# Patient Record
Sex: Female | Born: 1995 | Race: Black or African American | Hispanic: No | Marital: Single | Smoking: Never smoker
Health system: Southern US, Community
[De-identification: ages and names within clinical notes are randomized; demographics above are authoritative.]

## PROBLEM LIST (undated history)

## (undated) DIAGNOSIS — R519 Headache, unspecified: Secondary | ICD-10-CM

## (undated) DIAGNOSIS — G932 Benign intracranial hypertension: Secondary | ICD-10-CM

## (undated) HISTORY — DX: Headache, unspecified: R51.9

## (undated) HISTORY — PX: LUMBAR PUNCTURE: SHX1985

## (undated) HISTORY — PX: EYE SURGERY: SHX253

---

## 2020-06-17 ENCOUNTER — Ambulatory Visit: Payer: Self-pay | Admitting: Obstetrics & Gynecology

## 2020-11-10 ENCOUNTER — Emergency Department (HOSPITAL_COMMUNITY): Payer: 59

## 2020-11-10 ENCOUNTER — Other Ambulatory Visit: Payer: Self-pay

## 2020-11-10 ENCOUNTER — Emergency Department (HOSPITAL_COMMUNITY)
Admission: EM | Admit: 2020-11-10 | Discharge: 2020-11-10 | Disposition: A | Discharge status: Home / Self Care | Payer: 59 | Attending: Emergency Medicine | Admitting: Emergency Medicine

## 2020-11-10 ENCOUNTER — Encounter (HOSPITAL_COMMUNITY): Payer: Self-pay

## 2020-11-10 DIAGNOSIS — J069 Acute upper respiratory infection, unspecified: Secondary | ICD-10-CM

## 2020-11-10 DIAGNOSIS — J029 Acute pharyngitis, unspecified: Secondary | ICD-10-CM | POA: Diagnosis not present

## 2020-11-10 DIAGNOSIS — Z20822 Contact with and (suspected) exposure to covid-19: Secondary | ICD-10-CM | POA: Insufficient documentation

## 2020-11-10 DIAGNOSIS — R059 Cough, unspecified: Secondary | ICD-10-CM | POA: Diagnosis present

## 2020-11-10 LAB — SARS CORONAVIRUS 2 (TAT 6-24 HRS): SARS Coronavirus 2: NEGATIVE

## 2020-11-10 MED ORDER — BENZONATATE 100 MG PO CAPS: 100.0000 mg | ORAL_CAPSULE | Freq: Three times a day (TID) | ORAL | 0 refills | Status: DC

## 2020-11-10 MED ORDER — ACETAMINOPHEN 500 MG PO TABS
1000.0000 mg | ORAL_TABLET | Freq: Once | ORAL | Status: AC
  Administered 2020-11-10: 1000 mg via ORAL
  Filled 2020-11-10: qty 2

## 2020-11-10 MED ORDER — KETOROLAC TROMETHAMINE 30 MG/ML IJ SOLN
30.0000 mg | Freq: Once | INTRAMUSCULAR | Status: DC
  Filled 2020-11-10: qty 1

## 2020-11-10 NOTE — Discharge Instructions (Addendum)
You were seen today for Viral URI symptoms. Your chest xray was reassuring and did not show signs of pneumonia or bronchitis. Please take Tessalon as needed every 8 hours for cough. You can take tylenol as needed for fever and body aches. IF tylenol alone doesn't help, you may also take motrin. If conditions worsen, please return back to the ED. If your covid/flu test is positive, you will receive a call from the ED.

## 2020-11-10 NOTE — ED Provider Notes (Addendum)
[facility]-EMERGENCY DEPT Provider Note   CSN: [id number] Arrival date & time: 11/10/20  1004     History Chief Complaint  Patient presents with   Cough    Christine Snyder is a 25 y.o. female.  HPI  Patient presents with cough, body aches, fatigue, sore throat x3 days.  She is COVID vaccinated, but not boosted.  No COVID-positive exposures.  Has tried Zaxby's, no relief.  Endorses some shortness of breath and some chest tightness.  History reviewed. No pertinent past medical history.  There are no problems to display for this patient.   History reviewed. No pertinent surgical history.   OB History   No obstetric history on file.     No family history on file.     Home Medications Prior to Admission medications   Not on File    Allergies    Patient has no allergy information on record.  Review of Systems   Review of Systems  Constitutional:  Positive for fatigue and fever.  HENT:  Positive for congestion and sore throat.   Respiratory:  Positive for cough, chest tightness and shortness of breath.   Cardiovascular:  Negative for chest pain and leg swelling.  Gastrointestinal:  Negative for diarrhea, nausea and vomiting.  Musculoskeletal:  Positive for myalgias.  Neurological:  Positive for headaches.   Physical Exam Updated Vital Signs BP 129/88   Pulse 93   Temp 98.3 F (36.8 C) (Oral)   Resp 16   Ht 5\' 4"  (1.626 m)   Wt 86.2 kg   LMP 11/07/2020 (Exact Date)   SpO2 98%   BMI 32.61 kg/m   Physical Exam Vitals and nursing note reviewed. Exam conducted with a chaperone present.  Constitutional:      General: She is not in acute distress.    Appearance: Normal appearance. She is obese.  HENT:     Head: Normocephalic and atraumatic.  Eyes:     General: No scleral icterus.    Extraocular Movements: Extraocular movements intact.     Pupils: Pupils are equal, round, and reactive to light.  Cardiovascular:     Rate and Rhythm:  Normal rate and regular rhythm.  Pulmonary:     Effort: Pulmonary effort is normal.     Breath sounds: Normal breath sounds.     Comments: Lungs are clear to auscultation bilaterally.  Patient speaking in full set sentences.  98% on room air.  No accessory muscle use.  Lung sounds are somewhat diminished, likely secondary to body habitus. Musculoskeletal:     Comments: Legs are symmetric.  No warmth, calf tenderness to palpation, edema.  Skin:    Coloration: Skin is not jaundiced.  Neurological:     Mental Status: She is alert. Mental status is at baseline.     Coordination: Coordination normal.    ED Results / Procedures / Treatments   Labs (all labs ordered are listed, but only abnormal results are displayed) Labs Reviewed - No data to display  EKG None  Radiology No results found.  Procedures Procedures   Medications Ordered in ED Medications  acetaminophen (TYLENOL) tablet 1,000 mg (has no administration in time range)  ketorolac (TORADOL) 30 MG/ML injection 30 mg (has no administration in time range)    ED Course  I have reviewed the triage vital signs and the nursing notes.  Pertinent labs & imaging results that were available during my care of the patient were reviewed by me and considered in my medical  decision making (see chart for details).    MDM Rules/Calculators/A&P                          Patient presents with viral URI symptoms x3 days.  Will order chest x-ray given somewhat diminished lung sounds, although I do suspect that is secondary to body habitus.  Some suspicion for pneumonia, but most likely believe this is a viral URI.  Will test for COVID and flu.  Will treat symptoms.  Further dispo pending chest x-ray.  Discussed option of Paxlovid if patient tested COVID-positive.  Patient states she is not interested.  X-ray is negative for signs of pneumonia or bronchitis.  Suspect viral URI.  She is PERC negative, not tachycardic, not tachypneic, not in  respiratory distress, satting at 98% on room air, doubt this is due to a PE.  Advised patient with return precautions.  Prescribing symptomatic medicine.  Discussed quarantine if positive for COVID.  Patient is in agreement with the plan.  Final Clinical Impression(s) / ED Diagnoses Final diagnoses:  None    Rx / DC Orders ED Discharge Orders     None        Theron Arista, PA-C 11/10/20 1205    Theron Arista, PA-C 11/10/20 1205    Alvira Monday, MD 11/11/20 [phone]

## 2020-11-10 NOTE — ED Triage Notes (Signed)
C/o coughing, body aches, fatigue, sore throat since Monday.  Reports dry cough.   A/Ox4 Ambulatory in triage.

## 2021-06-08 ENCOUNTER — Emergency Department (HOSPITAL_BASED_OUTPATIENT_CLINIC_OR_DEPARTMENT_OTHER): Payer: Medicaid Other | Admitting: Radiology

## 2021-06-08 ENCOUNTER — Emergency Department (HOSPITAL_BASED_OUTPATIENT_CLINIC_OR_DEPARTMENT_OTHER)
Admission: EM | Admit: 2021-06-08 | Discharge: 2021-06-08 | Disposition: A | Discharge status: Home / Self Care | Payer: Medicaid Other | Attending: Emergency Medicine | Admitting: Emergency Medicine

## 2021-06-08 ENCOUNTER — Encounter (HOSPITAL_BASED_OUTPATIENT_CLINIC_OR_DEPARTMENT_OTHER): Payer: Self-pay | Admitting: Obstetrics and Gynecology

## 2021-06-08 ENCOUNTER — Other Ambulatory Visit: Payer: Self-pay

## 2021-06-08 DIAGNOSIS — R000 Tachycardia, unspecified: Secondary | ICD-10-CM | POA: Insufficient documentation

## 2021-06-08 DIAGNOSIS — Z20822 Contact with and (suspected) exposure to covid-19: Secondary | ICD-10-CM | POA: Insufficient documentation

## 2021-06-08 DIAGNOSIS — J111 Influenza due to unidentified influenza virus with other respiratory manifestations: Secondary | ICD-10-CM

## 2021-06-08 DIAGNOSIS — J101 Influenza due to other identified influenza virus with other respiratory manifestations: Secondary | ICD-10-CM | POA: Insufficient documentation

## 2021-06-08 DIAGNOSIS — R0789 Other chest pain: Secondary | ICD-10-CM | POA: Insufficient documentation

## 2021-06-08 LAB — RESP PANEL BY RT-PCR (FLU A&B, COVID) ARPGX2
Influenza A by PCR: POSITIVE — AB
Influenza B by PCR: NEGATIVE
SARS Coronavirus 2 by RT PCR: NEGATIVE

## 2021-06-08 MED ORDER — BENZONATATE 100 MG PO CAPS: 100.0000 mg | ORAL_CAPSULE | Freq: Three times a day (TID) | ORAL | 0 refills | Status: DC

## 2021-06-08 NOTE — ED Provider Notes (Signed)
[facility] EMERGENCY DEPT Provider Note   CSN: [id number] Arrival date & time: 06/08/21  1746     History  Chief Complaint  Patient presents with   Chest Pain   Headache   Generalized Body Aches    Christine Snyder is a 26 y.o. female presenting with body aches, headaches, chest tightness, fevers to 101 and abdominal discomfort for the past 2 days.  Patient denies any known sick contacts.  Has taken Robitussin that has slightly helped her however has not tried anything else.  Denies any difficulty breathing and states that her chest pain is localized to her breast area.  No nausea or vomiting, some associated diarrhea.   Chest Pain Associated symptoms: headache   Headache     Home Medications Prior to Admission medications   Medication Sig Start Date End Date Taking? Authorizing Provider  benzonatate (TESSALON) 100 MG capsule Take 1 capsule (100 mg total) by mouth every 8 (eight) hours. 11/10/20   Theron Arista, PA-C      Allergies    Patient has no known allergies.    Review of Systems   Review of Systems  Cardiovascular:  Positive for chest pain.  Neurological:  Positive for headaches.   Physical Exam Updated Vital Signs BP 120/84 (BP Location: Right Arm)    Pulse (!) 111    Temp 99.4 F (37.4 C)    Resp 16    Ht 5\' 4"  (1.626 m)    Wt 86.2 kg    LMP 06/04/2021 (Approximate)    SpO2 97%    BMI 32.61 kg/m  Physical Exam Vitals and nursing note reviewed.  Constitutional:      General: She is not in acute distress.    Appearance: Normal appearance. She is ill-appearing.  HENT:     Head: Normocephalic and atraumatic.     Right Ear: Tympanic membrane normal. No tenderness.     Left Ear: Tympanic membrane normal. No tenderness.     Mouth/Throat:     Lips: Pink.     Mouth: Mucous membranes are moist.     Pharynx: Oropharynx is clear. Uvula midline. No pharyngeal swelling or oropharyngeal exudate.  Eyes:     General: No scleral icterus.     Conjunctiva/sclera: Conjunctivae normal.  Cardiovascular:     Rate and Rhythm: Regular rhythm. Tachycardia present.  Pulmonary:     Effort: Pulmonary effort is normal. No tachypnea or respiratory distress.     Breath sounds: No decreased breath sounds.  Chest:     Chest wall: No tenderness.  Abdominal:     Palpations: Abdomen is soft.     Tenderness: There is no abdominal tenderness.  Musculoskeletal:     Right lower leg: No edema.     Left lower leg: No edema.  Skin:    General: Skin is warm and dry.     Findings: No rash.  Neurological:     Mental Status: She is alert.  Psychiatric:        Mood and Affect: Mood normal.        Behavior: Behavior normal.    ED Results / Procedures / Treatments   Labs (all labs ordered are listed, but only abnormal results are displayed) Labs Reviewed  RESP PANEL BY RT-PCR (FLU A&B, COVID) ARPGX2 - Abnormal; Notable for the following components:      Result Value   Influenza A by PCR POSITIVE (*)    All other components within normal limits    EKG EKG  Interpretation  Date/Time:  Wednesday June 08 2021 17:54:57 EST Ventricular Rate:  111 PR Interval:  120 QRS Duration: 72 QT Interval:  322 QTC Calculation: 437 R Axis:   52 Text Interpretation: Sinus tachycardia Otherwise normal ECG No previous ECGs available Confirmed by Benjiman Core [phone]) on 06/08/2021 6:12:04 PM  Radiology DG Chest 2 View  Result Date: 06/08/2021 CLINICAL DATA:  Cough, fever and chills beginning yesterday. Chest pain. EXAM: CHEST - 2 VIEW COMPARISON:  11/10/2020 FINDINGS: The heart size and mediastinal contours are within normal limits. Both lungs are clear. The visualized skeletal structures are unremarkable. IMPRESSION: No active cardiopulmonary disease. Electronically Signed   By: Danae Orleans M.D.   On: 06/08/2021 18:23    Procedures Procedures   Medications Ordered in ED Medications - No data to display  ED Course/ Medical Decision Making/ A&P                            Medical Decision Making Amount and/or Complexity of Data Reviewed Radiology: ordered.   26 year old female with URI symptoms.  Chest pain seems to be more consistent with her body aches and is reproducible on physical exam.  Low likelihood for PE.  Testing: Patient is flu positive.  She is not high risk for severe illness, Tamiflu not prescribed. -EKG in sinus tachycardia, confirmed by MD Pickering.  Tachycardia likely secondary to illness  Imaging: No signs of pneumonia, individually ordered, interpreted by me.  I agree with the radiologist.  She is tolerating p.o. intake, and we discussed proper over-the-counter care for the symptoms. Tessalon also prescribed. She voiced understanding and is agreeable to discharge home with work note  Final Clinical Impression(s) / ED Diagnoses Final diagnoses:  Flu    Rx / DC Orders Results and diagnoses were explained to the patient. Return precautions discussed in full. Patient had no additional questions and expressed complete understanding.   This chart was dictated using voice recognition software.  Despite best efforts to proofread,  errors can occur which can change the documentation meaning.    Woodroe Chen 06/08/21 1926    Benjiman Core, MD 06/09/21 1242

## 2021-06-08 NOTE — Discharge Instructions (Signed)
You may use ibuprofen over-the-counter for fevers and body aches.  You may also try DayQuil/NyQuil.  Do not take Tylenol with this because it is already built in.  I have sent benzonatate pills to your pharmacy for your cough.  A work note is attached

## 2021-06-08 NOTE — ED Notes (Signed)
Pt provided discharge instructions and prescription information. Pt was given the opportunity to ask questions and questions were answered. Discharge signature not obtained in the setting of the COVID-19 pandemic in order to reduce high touch surfaces.  ° °

## 2021-06-08 NOTE — ED Triage Notes (Signed)
Patient reports she started feeling sick on Monday. Endorses cough, chest pain, chills, sore throat and headaches.

## 2022-03-18 ENCOUNTER — Encounter (HOSPITAL_COMMUNITY): Payer: Self-pay | Admitting: *Deleted

## 2022-03-18 ENCOUNTER — Emergency Department (HOSPITAL_COMMUNITY)
Admission: EM | Admit: 2022-03-18 | Discharge: 2022-03-18 | Disposition: A | Discharge status: Home / Self Care | Payer: 59 | Attending: Emergency Medicine | Admitting: Emergency Medicine

## 2022-03-18 ENCOUNTER — Other Ambulatory Visit: Payer: Self-pay

## 2022-03-18 DIAGNOSIS — R519 Headache, unspecified: Secondary | ICD-10-CM | POA: Insufficient documentation

## 2022-03-18 LAB — COMPREHENSIVE METABOLIC PANEL
ALT: 15 U/L (ref 0–44)
AST: 17 U/L (ref 15–41)
Albumin: 4.1 g/dL (ref 3.5–5.0)
Alkaline Phosphatase: 61 U/L (ref 38–126)
Anion gap: 8 (ref 5–15)
BUN: 12 mg/dL (ref 6–20)
CO2: 24 mmol/L (ref 22–32)
Calcium: 9.2 mg/dL (ref 8.9–10.3)
Chloride: 106 mmol/L (ref 98–111)
Creatinine, Ser: 0.63 mg/dL (ref 0.44–1.00)
GFR, Estimated: >60 mL/min (ref >60)
Glucose, Bld: 87 mg/dL (ref 70–99)
Potassium: 4 mmol/L (ref 3.5–5.1)
Sodium: 138 mmol/L (ref 135–145)
Total Bilirubin: 0.6 mg/dL (ref 0.3–1.2)
Total Protein: 7.9 g/dL (ref 6.5–8.1)

## 2022-03-18 LAB — CBC WITH DIFFERENTIAL/PLATELET
Abs Immature Granulocytes: 0.03 10*3/uL (ref 0.00–0.07)
Basophils Absolute: 0 10*3/uL (ref 0.0–0.1)
Basophils Relative: 0 %
Eosinophils Absolute: 0.2 10*3/uL (ref 0.0–0.5)
Eosinophils Relative: 2 %
HCT: 38.1 % (ref 36.0–46.0)
Hemoglobin: 11.7 g/dL — ABNORMAL LOW (ref 12.0–15.0)
Immature Granulocytes: 0 %
Lymphocytes Relative: 18 %
Lymphs Abs: 1.7 10*3/uL (ref 0.7–4.0)
MCH: 25.5 pg — ABNORMAL LOW (ref 26.0–34.0)
MCHC: 30.7 g/dL (ref 30.0–36.0)
MCV: 83 fL (ref 80.0–100.0)
Monocytes Absolute: 0.4 10*3/uL (ref 0.1–1.0)
Monocytes Relative: 5 %
Neutro Abs: 6.9 10*3/uL (ref 1.7–7.7)
Neutrophils Relative %: 75 %
Platelets: 364 10*3/uL (ref 150–400)
RBC: 4.59 MIL/uL (ref 3.87–5.11)
RDW: 15.2 % (ref 11.5–15.5)
WBC: 9.3 10*3/uL (ref 4.0–10.5)
nRBC: 0 % (ref 0.0–0.2)

## 2022-03-18 MED ORDER — DIPHENHYDRAMINE HCL 50 MG/ML IJ SOLN
12.5000 mg | Freq: Once | INTRAMUSCULAR | Status: AC
  Administered 2022-03-18: 12.5 mg via INTRAVENOUS
  Filled 2022-03-18: qty 1

## 2022-03-18 MED ORDER — PROCHLORPERAZINE EDISYLATE 10 MG/2ML IJ SOLN
10.0000 mg | Freq: Once | INTRAMUSCULAR | Status: AC
  Administered 2022-03-18: 10 mg via INTRAVENOUS
  Filled 2022-03-18: qty 2

## 2022-03-18 MED ORDER — KETOROLAC TROMETHAMINE 30 MG/ML IJ SOLN
30.0000 mg | Freq: Once | INTRAMUSCULAR | Status: AC
  Administered 2022-03-18: 30 mg via INTRAVENOUS
  Filled 2022-03-18: qty 1

## 2022-03-18 NOTE — ED Provider Notes (Signed)
[facility] DEPT Provider Note   CSN: [id number] Arrival date & time: 03/18/22  1348     History  Chief Complaint  Patient presents with   Headache    Christine Snyder is a 26 y.o. female.  Differential diagnosis includes but not limited to, migraine headaches, headache associated with ICH, meningitis   Headache    Patient presents ED with complaints of headache that started several days ago.  Patient states she has been having issues with intermittent headaches.  This has been ongoing for several months.  She actually was seen by neurologist in April of this year.  Patient had been evaluated by an ophthalmologist who noted findings concerning for possible intracranial hypertension.  Patient states she had an outpatient MRI and followed up with a neurologist in [place].  Patient was supposed to have an LP at some point she states the doctor never got back to her schedule the LP.  Patient states that she stopped having headaches for a while but will get them every couple of weeks.  She denies any acute vision changes.  No numbness or weakness.  Home Medications Prior to Admission medications   Medication Sig Start Date End Date Taking? Authorizing Provider  benzonatate (TESSALON) 100 MG capsule Take 1 capsule (100 mg total) by mouth every 8 (eight) hours. 06/08/21   Redwine, Madison A, PA-C      Allergies    Patient has no known allergies.    Review of Systems   Review of Systems  Neurological:  Positive for headaches.    Physical Exam Updated Vital Signs BP 125/81 (BP Location: Left Arm)   Pulse 84   Temp 98.2 F (36.8 C) (Oral)   Resp 18   Ht 1.651 m (5\' 5" )   Wt 83.9 kg   LMP 01/22/2022 (Exact Date)   SpO2 100%   BMI 30.79 kg/m  Physical Exam Vitals and nursing note reviewed.  Constitutional:      General: She is not in acute distress.    Appearance: She is well-developed.  HENT:     Head: Normocephalic and atraumatic.      Comments: Normal TMs, pupil equal round and reactive    Right Ear: External ear normal.     Left Ear: External ear normal.  Eyes:     General: No scleral icterus.       Right eye: No discharge.        Left eye: No discharge.     Conjunctiva/sclera: Conjunctivae normal.  Neck:     Trachea: No tracheal deviation.  Cardiovascular:     Rate and Rhythm: Normal rate and regular rhythm.  Pulmonary:     Effort: Pulmonary effort is normal. No respiratory distress.     Breath sounds: Normal breath sounds. No stridor. No wheezing or rales.  Abdominal:     General: Bowel sounds are normal. There is no distension.     Palpations: Abdomen is soft.     Tenderness: There is no abdominal tenderness. There is no guarding or rebound.  Musculoskeletal:        General: No tenderness or deformity.     Cervical back: Neck supple. No rigidity.  Skin:    General: Skin is warm and dry.     Findings: No rash.  Neurological:     General: No focal deficit present.     Mental Status: She is alert.     Cranial Nerves: No cranial nerve deficit (no facial droop, extraocular movements  intact, no slurred speech).     Sensory: No sensory deficit.     Motor: No abnormal muscle tone or seizure activity.     Coordination: Coordination normal.  Psychiatric:        Mood and Affect: Mood normal.     ED Results / Procedures / Treatments   Labs (all labs ordered are listed, but only abnormal results are displayed) Labs Reviewed  CBC WITH DIFFERENTIAL/PLATELET - Abnormal; Notable for the following components:      Result Value   Hemoglobin 11.7 (*)    MCH 25.5 (*)    All other components within normal limits  COMPREHENSIVE METABOLIC PANEL  CBC WITH DIFFERENTIAL/PLATELET  I-STAT BETA HCG BLOOD, ED ([facility], [facility], [facility] ONLY)    EKG None  Radiology No results found.  Procedures Procedures    Medications Ordered in ED Medications  prochlorperazine (COMPAZINE) injection 10 mg (10 mg Intravenous Given 03/18/22  1836)  ketorolac (TORADOL) 30 MG/ML injection 30 mg (30 mg Intravenous Given 03/18/22 1837)  diphenhydrAMINE (BENADRYL) injection 12.5 mg (12.5 mg Intravenous Given 03/18/22 1837)    ED Course/ Medical Decision Making/ A&P                           Medical Decision Making Risk Prescription drug management.   Exam is reassuring.  Patient does not have any symptoms to suggest infection.  Neck is supple.  Doubt meningitis or subarachnoid hemorrhage.  Patient was treated with migraine cocktail.  She is feeling much better at this time.  Headache is resolved and she is ready for discharge.  I did discuss lumbar puncture in the ED to assist with her evaluation of her possible ICH.  Patient prefers to follow-up with neurology as an outpatient.  Patient's not having any acute visual symptoms.  No neurologic deficits.  Do feel that she is appropriate for outpatient follow-up.  Recommend she follow-up with the neurologist she was seen previously but I also provided alternative neurologists in the area as she requested.        Final Clinical Impression(s) / ED Diagnoses Final diagnoses:  Acute nonintractable headache, unspecified headache type    Rx / DC Orders ED Discharge Orders     None         Dorie Rank, MD 03/18/22 2011

## 2022-03-18 NOTE — ED Triage Notes (Signed)
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 HA associated with light headedness, behind L eye, floaters and ear ringing. No relief with excedrin this am.

## 2022-03-18 NOTE — ED Provider Triage Note (Signed)
Emergency Medicine Provider Triage Evaluation Note  Christine Snyder , a 26 y.o. female  was evaluated in triage.  Pt complains of a headache Pt has a ICH.  Pt did not follow upwith neurology  no treatment  Review of Systems  Positive: headache Negative: fever  Physical Exam  BP (!) 140/92 (BP Location: Right Arm)   Pulse 75   Temp 98.3 F (36.8 C) (Oral)   Resp 18   Ht 5\' 5"  (1.651 m)   Wt 83.9 kg   SpO2 99%   BMI 30.79 kg/m  Gen:   Awake, no distress   Resp:  Normal effort  MSK:   Moves extremities without difficulty  Other:    Medical Decision Making  Medically screening exam initiated at 2:38 PM.  Appropriate orders placed.  Stevee Osinski was informed that the remainder of the evaluation will be completed by another provider, this initial triage assessment does not replace that evaluation, and the importance of remaining in the ED until their evaluation is complete.     Fransico Meadow, [place] 03/18/22 1439

## 2022-03-18 NOTE — Discharge Instructions (Signed)
Follow-up with a neurologist as we discussed.  Return as needed for worsening symptoms.

## 2022-04-14 DIAGNOSIS — Z419 Encounter for procedure for purposes other than remedying health state, unspecified: Secondary | ICD-10-CM | POA: Diagnosis not present

## 2022-05-04 ENCOUNTER — Telehealth: Payer: Self-pay

## 2022-05-04 NOTE — Telephone Encounter (Signed)
LMTCB. AS, CMA 

## 2022-05-15 DIAGNOSIS — Z419 Encounter for procedure for purposes other than remedying health state, unspecified: Secondary | ICD-10-CM | POA: Diagnosis not present

## 2022-05-29 ENCOUNTER — Telehealth: Payer: Self-pay

## 2022-05-29 ENCOUNTER — Encounter: Payer: Self-pay | Admitting: Neurology

## 2022-05-29 ENCOUNTER — Ambulatory Visit (INDEPENDENT_AMBULATORY_CARE_PROVIDER_SITE_OTHER): Payer: 59 | Admitting: Neurology

## 2022-05-29 VITALS — BP 132/79 | HR 92 | Ht 65.0 in | Wt 192.6 lb

## 2022-05-29 DIAGNOSIS — R635 Abnormal weight gain: Secondary | ICD-10-CM

## 2022-05-29 DIAGNOSIS — H4710 Unspecified papilledema: Secondary | ICD-10-CM | POA: Diagnosis not present

## 2022-05-29 DIAGNOSIS — G932 Benign intracranial hypertension: Secondary | ICD-10-CM

## 2022-05-29 MED ORDER — ACETAZOLAMIDE 250 MG PO TABS: 250.0000 mg | ORAL_TABLET | Freq: Two times a day (BID) | ORAL | 3 refills | Status: DC

## 2022-05-29 NOTE — Progress Notes (Addendum)
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 female here as requested by Dorie Rank, MD for Headaches. I reviewed EPIC, she has no PMHx in her chart. I reviewed ED notes from 03/18/2022 Dr. Tomi Bamberger she presented with headaches for several months. She saw a neurologist and ophthalmologist with [facility] for IDIOPATHIC INTRACRANIAL HYPERTENSION. She had an outpatient MRi never had an LP. Exam was reassuring and a migraine cocktail helped and she was discharged. I do not have her neurology notes. She went to her eye doctor, noticed [place] edema. Ringing in ears, daily headaches, pressure and more in the nack of the head and behind her ears with pressure, [facility], she wears glasses and she thought her blurry vision was just getting older, having floaters, episodes of blurry and bending over would go completely dark. Also hearing issues, fluid behind the ears, like in a seashell, neck pain. She is here from [name] she needs an IDIOPATHIC INTRACRANIAL HYPERTENSION workup. Getting [facility] notes. Moreso pressure. Especially in the back.   Reviewed notes, labs and imaging from outside physicians, which showed:  Reviewed imaging on CD: MRI brain w/wo, orbits w/wo and MRV all unremarkable.   Per [employer] notes: For evaluation of optic nerves swelling OS complaining of moderately blurred double vision and pain, she has a history of strabismus but after surgery in 2000 and has not been as pronounced, complained of foot years, diplopia OU, ringing in ears, constant headache.  Patient was diagnosed with optic nerve head edema optic disc on the right as  well as optic disc on the left.  Review of Systems: Patient complains of symptoms per HPI as well as the following symptoms headaches. Pertinent negatives and positives per HPI. All others negative.   Social History   Socioeconomic History   Marital status: Single    Spouse name: Not on file   Number of children: Not on file   Years of education: Not on file   Highest education level: Not on file  Occupational History   Not on file  Tobacco Use   Smoking status: Never    Passive exposure: Never   Smokeless tobacco: Never  Vaping Use   Vaping Use: Never used  Substance and Sexual Activity   Alcohol use: Not Currently   Drug use: Not Currently   Sexual activity: Yes    Birth control/protection: None  Other Topics Concern   Not on file  Social History Narrative   Caffiene no. 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 60 tablet 3   No current facility-administered medications for this visit.    Allergies as of 05/29/2022   (No Known Allergies)    Vitals: BP 132/79   Pulse 92   Ht 5' 5"$  (1.651 m)   Wt 192 lb 9.6 oz (87.4 kg)    BMI 32.05 kg/m  Last Weight:  Wt Readings from Last 1 Encounters:  05/29/22 192 lb 9.6 oz (87.4 kg)   Last Height:   Ht Readings from Last 1 Encounters:  05/29/22 5' 5"$  (1.651 m)     Physical exam: Exam: Gen: NAD, conversant, well nourised, obese, well groomed                     CV: RRR, no MRG. No Carotid Bruits. No peripheral edema, warm, nontender Eyes: Conjunctivae clear without exudates or hemorrhage  Neuro: Detailed Neurologic Exam  Speech:    Speech is normal; fluent and spontaneous with normal comprehension.  Cognition:    The patient is oriented to person, place, and time;     recent and remote memory intact;     language fluent;     normal attention, concentration,     fund of knowledge Cranial Nerves:    The pupils are equal, round, and reactive to light.  mild blurring optic nerves heads. Visual fields are full to finger confrontation. Extraocular movements are intact. Trigeminal sensation is intact and the muscles of mastication are normal. The face is symmetric. The palate elevates in the midline. Hearing intact. Voice is normal. Shoulder shrug is normal. The tongue has normal motion without fasciculations.   Coordination: nml  Gait: nml  Motor Observation:    No asymmetry, no atrophy, and no involuntary movements noted. Tone:    Normal muscle tone.    Posture:    Posture is normal. normal erect    Strength:    Strength is V/V in the upper and lower limbs.      Sensation: intact to LT     Reflex Exam:  DTR's:    Deep tendon reflexes in the upper and lower extremities are normal bilaterally.   Toes:    The toes are downgoing bilaterally.   Clonus:    Clonus is absent.    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 Imaging unremarkable. Appears very mild.  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 GKaty Fitch [facility] Need a primary care CHarrison Monsor one of her colleagues recommended : Address: [address], [place] [address]Phone: [phone]   Weight loss is critical, discussed weight loss and risk of permanent vision loss  Orders Placed This Encounter  Procedures   DG FLUORO GUIDED LOC OF NEEDLE/CATH TIP FOR SPINAL INJECT LT   TSH Rfx on Abnormal to Free T4  Ambulatory referral to Ophthalmology   Meds ordered this encounter  Medications   acetaZOLAMIDE (DIAMOX) 250 MG tablet    Sig: Take 1 tablet (250 mg total) by mouth 2 (two) times daily.    Dispense:  60 tablet    Refill:  3   F/u 8 weeks  For any acute change especially worsening headache or vision loss call 911 and proceed to ED  To prevent or relieve headaches, try the following: Cool Compress. Lie down and place a cool compress on your head.  Avoid headache triggers. If certain foods or odors seem to have triggered your migraines in the past, avoid them. A headache diary might help you identify triggers.  Include physical activity in your daily routine. Try a daily walk or other moderate aerobic exercise.  Manage stress. Find healthy ways to cope with the stressors, such as delegating tasks on your to-do list.  Practice relaxation techniques. Try deep breathing, yoga, massage and visualization.  Eat regularly. Eating regularly scheduled meals and maintaining a healthy diet might help prevent headaches. Also, drink plenty of fluids.  Follow a regular sleep schedule. Sleep deprivation might contribute to headaches Consider biofeedback. With this mind-body technique, you learn to control certain bodily functions -- such as muscle tension, heart rate and blood pressure -- to prevent headaches or reduce  headache pain.    Proceed to emergency room if you experience new or worsening symptoms or symptoms do not resolve, if you have new neurologic symptoms or if headache is severe, or for any concerning symptom.   Provided education and documentation from American headache Society toolbox including articles on: pseudotumoer cerebri(IIH), chronic migraine medication overuse headache, chronic migraines, prevention of migraines and other headaches, behavioral and other nonpharmacologic treatments for headache.   Orders Placed This Encounter  Procedures   DG FLUORO GUIDED LOC OF NEEDLE/CATH TIP FOR SPINAL INJECT LT   TSH Rfx on Abnormal to Free T4   Ambulatory referral to Ophthalmology   Meds ordered this encounter  Medications   acetaZOLAMIDE (DIAMOX) 250 MG tablet    Sig: Take 1 tablet (250 mg total) by mouth 2 (two) times daily.    Dispense:  60 tablet    Refill:  3    Cc: Dorie Rank, MD,  Pcp, No  Sarina Ill, MD  [facility] [address] [place], [place] [address]  Phone [phone] Fax [phone]

## 2022-05-29 NOTE — Telephone Encounter (Signed)
Care everywhere

## 2022-05-29 NOTE — Patient Instructions (Signed)
Lumbar puncture Then start Diamox Ophthalmologist Dr. Katy Fitch Need a primary care Christine Snyder : Address: [address], [place], [place] [address] Phone: [phone]  Drs Katy Fitch - OCT and RNFL for papilledema confirmed by [facility] she does not want to go there again [facility] you bypass for this patient**  Orders Placed This Encounter  Procedures   DG FLUORO GUIDED LOC OF NEEDLE/CATH TIP FOR SPINAL INJECT LT   Ambulatory referral to Ophthalmology   Meds ordered this encounter  Medications   acetaZOLAMIDE (DIAMOX) 250 MG tablet    Sig: Take 1 tablet (250 mg total) by mouth 2 (two) times daily.    Dispense:  60 tablet    Refill:  3     Acetazolamide Tablets What is this medication? ACETAZOLAMIDE (a set a ZOLE a mide) reduces swelling related to heart disease. It may also be used to reduce swelling caused by medications. It helps your kidneys remove more fluid and salt from your blood through the urine. It may also be used to treat conditions with increased pressure of the eye, such as glaucoma. It can be used with other medications to prevent and control seizures in people with epilepsy. It can also be used to prevent or treat symptoms of altitude sickness. It works by increasing the amount of oxygen in your body. It belongs to a group of medications called diuretics. This medicine may be used for other purposes; ask your health care provider or pharmacist if you have questions. COMMON BRAND NAME(S): Diamox What should I tell my care team before I take this medication? They need to know if you have any of these conditions: Glaucoma Kidney disease Liver disease Low adrenal gland function Lung or breathing disease An unusual or allergic reaction to acetazolamide, other medications, foods, dyes, or preservatives Pregnant or trying to get pregnant Breast-feeding How should I use this medication? Take this medication by mouth. Take it as directed on the  prescription label at the same time every day. You can take it with or without food. If it upsets your stomach, take it with food. Keep taking it unless your care team tells you to stop. Talk to your care team about the use of this medication in children. Special care may be needed. Overdosage: If you think you have taken too much of this medicine contact a poison control center or emergency room at once. NOTE: This medicine is only for you. Do not share this medicine with others. What if I miss a dose? If you miss a dose, take it as soon as you can. If it is almost time for your next dose, take only that dose. Do not take double or extra doses. What may interact with this medication? Do not take this medication with any of the following: Methazolamide This medication may also interact with the following: Aspirin and aspirin-like medications Cyclosporine Lithium Medication for diabetes Methenamine Other diuretics Phenytoin Primidone Quinidine Sodium bicarbonate Stimulant medications, such as dextroamphetamine This list may not describe all possible interactions. Give your health care provider a list of all the medicines, herbs, non-prescription drugs, or dietary supplements you use. Also tell them if you smoke, drink alcohol, or use illegal drugs. Some items may interact with your medicine. What should I watch for while using this medication? Visit your care team for regular checks on your progress. Tell your care team if your symptoms do not start to get better or if they get worse. This medication may  cause serious skin reactions. They can happen weeks to months after starting the medication. Contact your care team right away if you notice fevers or flu-like symptoms with a rash. The rash may be red or purple and then turn into blisters or peeling of the skin. Or, you might notice a red rash with swelling of the face, lips, or lymph nodes in your neck or under your arms. This medication  may affect your coordination, reaction time, or judgment. Do not drive or operate machinery until you know how this medication affects you. Sit up or stand slowly to reduce the risk of dizzy or fainting spells. What side effects may I notice from receiving this medication? Side effects that you should report to your care team as soon as possible: Allergic reactions--skin rash, itching, hives, swelling of the face, lips, tongue, or throat Aplastic anemia--unusual weakness or fatigue, dizziness, headache, trouble breathing, increased bleeding or bruising High acid level--trouble breathing, unusual weakness or fatigue, confusion, headache, fast or irregular heartbeat, nausea, vomiting Infection--fever, chills, cough, or sore throat Kidney stones--blood in the urine, pain or trouble passing urine, pain in the lower back or sides Liver injury--right upper belly pain, loss of appetite, nausea, light-colored stool, dark yellow or brown urine, yellowing skin or eyes, unusual weakness or fatigue Low potassium level--muscle pain or cramps, unusual weakness or fatigue, fast or irregular heartbeat, constipation Redness, blistering, peeling, or loosening of the skin, including inside the mouth Side effects that usually do not require medical attention (report to your care team if they continue or are bothersome): Blurry vision Change in taste Loss of appetite Pain, tingling, or numbness in the hands or feet This list may not describe all possible side effects. Call your doctor for medical advice about side effects. You may report side effects to FDA at 1-800-FDA-1088. Where should I keep my medication? Keep out of the reach of children and pets. Store at room temperature between 20 and 25 degrees C (68 and 77 degrees F). Throw away any unused medication after the expiration date. NOTE: This sheet is a summary. It may not cover all possible information. If you have questions about this medicine, talk to your  doctor, pharmacist, or health care provider.  2023 Elsevier/Gold Standard (2021-06-10 00:00:00)  Idiopathic Intracranial Hypertension  Idiopathic intracranial hypertension (IIH) is a condition that increases pressure around the brain. The fluid that surrounds the brain and spinal cord (cerebrospinal fluid, or CSF) increases and causes the pressure. Idiopathic means that the cause of this condition is not known. IIH affects the brain and spinal cord. If this condition is not treated, it can cause vision loss or blindness. What are the causes? The cause of this condition is not known. What increases the risk? The following factors may make you more likely to develop this condition: Being obese. Being a person who is female, between the ages of 21 and 23 years old, and who has not gone through menopause. Taking certain medicines, such as birth control, acne medicines, or steroids. What are the signs or symptoms? Symptoms of this condition include: Headaches. This is the most common symptom. Brief periods of total blindness. Double vision, blurred vision, or poor side (peripheral) vision. Pain in the shoulders or neck. Nausea and vomiting. A sound like rushing water or a pulsing sound within the ears (pulsatile tinnitus), or ringing in the ears. How is this diagnosed? This condition may be diagnosed based on: Your symptoms and medical history. Imaging tests of the brain, such  as: CT scan. MRI. Magnetic resonance venogram (MRV) to check the veins. Diagnostic lumbar puncture. This is a procedure to remove and examine a sample of CSF. This procedure can determine whether your fluid pressure is too high. An eye exam to check for swelling or nerve damage in the eyes. How is this treated? Treatment for this condition depends on the symptoms. The goal of treatment is to decrease the pressure around your brain. Common treatments include: Weight loss through healthy eating, salt restriction, and  exercise, if you are overweight. Medicines to decrease the production of CSF and lower the pressure within your skull. Medicines to prevent or treat headaches. Other treatments may include: Surgery to place drains (shunts) in your brain to remove extra fluid. Lumbar puncture to remove extra CSF. Follow these instructions at home: If you are overweight or obese, work with your health care provider to lose weight. Take over-the-counter and prescription medicines only as told by your health care provider. Ask your health care provider if the medicine prescribed to you requires you to avoid driving or using machinery. Do not use any products that contain nicotine or tobacco. These products include cigarettes, chewing tobacco, and vaping devices, such as e-cigarettes. If you need help quitting, ask your health care provider. Keep all follow-up visits. Your health care provider will need to monitor you regularly. Contact a health care provider if: You have changes in your vision, such as: Double vision. Blurred vision. Poor peripheral vision. Get help right away if: You have any of the following symptoms and they get worse or do not get better: Headaches. Nausea. Vomiting. Sudden trouble seeing. This information is not intended to replace advice given to you by your health care provider. Make sure you discuss any questions you have with your health care provider. Document Revised: 09/27/2021 Document Reviewed: 09/06/2021 Elsevier Patient Education  [place].

## 2022-05-30 ENCOUNTER — Telehealth: Payer: Self-pay | Admitting: Neurology

## 2022-05-30 LAB — TSH RFX ON ABNORMAL TO FREE T4: TSH: 0.979 u[IU]/mL (ref 0.450–4.500)

## 2022-05-30 NOTE — Telephone Encounter (Signed)
Referral sent to [facility], phone # [phone]. 

## 2022-06-06 ENCOUNTER — Encounter: Payer: Self-pay | Admitting: Neurology

## 2022-06-06 DIAGNOSIS — G971 Other reaction to spinal and lumbar puncture: Secondary | ICD-10-CM

## 2022-06-08 DIAGNOSIS — Z0289 Encounter for other administrative examinations: Secondary | ICD-10-CM

## 2022-06-13 ENCOUNTER — Inpatient Hospital Stay: Admission: RE | Admit: 2022-06-13 | Payer: 59 | Source: Ambulatory Visit

## 2022-06-15 DIAGNOSIS — Z419 Encounter for procedure for purposes other than remedying health state, unspecified: Secondary | ICD-10-CM | POA: Diagnosis not present

## 2022-06-15 NOTE — Telephone Encounter (Signed)
FMLA ready for Dr Jaynee Eagles to review, answer question about episodes/month then sign.

## 2022-06-15 NOTE — Telephone Encounter (Signed)
Forms ready for pick up

## 2022-06-15 NOTE — Telephone Encounter (Signed)
FMLA completed, signed, and sent to medical records for processing.

## 2022-06-22 NOTE — Discharge Instructions (Signed)

## 2022-06-23 ENCOUNTER — Ambulatory Visit
Admission: RE | Admit: 2022-06-23 | Discharge: 2022-06-23 | Disposition: A | Discharge status: Home / Self Care | Payer: 59 | Source: Ambulatory Visit | Attending: Neurology | Admitting: Neurology

## 2022-06-23 VITALS — BP 126/80 | HR 82

## 2022-06-23 DIAGNOSIS — G932 Benign intracranial hypertension: Secondary | ICD-10-CM

## 2022-06-23 DIAGNOSIS — R635 Abnormal weight gain: Secondary | ICD-10-CM

## 2022-06-23 DIAGNOSIS — H4710 Unspecified papilledema: Secondary | ICD-10-CM

## 2022-06-23 LAB — GLUCOSE, CSF: Glucose, CSF: 64 mg/dL (ref 40–80)

## 2022-06-23 LAB — CSF CELL COUNT WITH DIFFERENTIAL
RBC Count, CSF: 1 cells/uL — ABNORMAL HIGH
TOTAL NUCLEATED CELL: 1 cells/uL (ref 0–5)

## 2022-06-23 LAB — PROTEIN, CSF: Total Protein, CSF: 29 mg/dL (ref 15–45)

## 2022-06-26 ENCOUNTER — Telehealth: Payer: Self-pay | Admitting: Neurology

## 2022-06-26 NOTE — Telephone Encounter (Signed)
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

## 2022-06-26 NOTE — Telephone Encounter (Signed)
I called pt and gave her the results of her LP that she had done,  elevated pressure of 36.  Dr. Jaynee Eagles wanted her to start on the diamox 219m po bid for IIH.(already sent to [facility]).  Read side effects let uKoreanow if problems or questions.  She appreciated call.

## 2022-06-27 NOTE — Telephone Encounter (Signed)
Order placed for blood patch. Sent pt a Therapist, music. Called and LVM for [facility] and sent secure message to her asking for patient to be seen today for blood patch if possible.

## 2022-06-27 NOTE — Addendum Note (Signed)
Addended by: Gildardo Griffes on: 06/27/2022 12:24 PM   Modules accepted: Orders

## 2022-06-28 ENCOUNTER — Ambulatory Visit
Admission: RE | Admit: 2022-06-28 | Discharge: 2022-06-28 | Disposition: A | Discharge status: Home / Self Care | Payer: 59 | Source: Ambulatory Visit | Attending: Neurology | Admitting: Neurology

## 2022-06-28 DIAGNOSIS — G971 Other reaction to spinal and lumbar puncture: Secondary | ICD-10-CM

## 2022-06-28 NOTE — Progress Notes (Signed)
Pt arrived at our facility for blood patch due to post LP headache. Pt reports her headaches are completely gone and she feels "good". The patient states she had a horrible headache yesterday with nausea but today it is completely gone. After speaking with Dr. Jeralyn Ruths, we will not do the blood patch today as the headache has resolved. The pt agrees with this plan. However, I explained to the patient if her headache returns to give [place] a call and we will work her back in our schedule. Pt verbalized understanding.

## 2022-06-28 NOTE — Discharge Instructions (Signed)
Blood Patch Discharge Instructions  Go home and rest quietly for the next 24 hours.  It is important to lie flat for the next 24 hours.  Get up only to go to the restroom.  You may lie in the bed or on a couch on your back, your stomach, your left side or your right side.  You may have one pillow under your head.  You may have pillows between your knees while you are on your side or under your knees while you are on your back.  DO NOT drive today.  Recline the seat as far back as it will go, while still wearing your seat belt, on the way home.  You may get up to go to the bathroom as needed.  You may sit up for 10 minutes to eat.  You may resume your normal diet and medications unless otherwise indicated.  Drink lots of extra fluids today and tomorrow..   You may resume normal activities after your 24 hours of bed rest is over; however, do not exert yourself strongly or do any heavy lifting tomorrow.  Call your physician for a follow-up appointment.   If you have any questions  after you arrive home, please call [phone].  Discharge instructions have been explained to the patient.  The patient, or the person responsible for the patient, fully understands these instructions.

## 2022-07-14 DIAGNOSIS — Z419 Encounter for procedure for purposes other than remedying health state, unspecified: Secondary | ICD-10-CM | POA: Diagnosis not present

## 2022-07-17 ENCOUNTER — Telehealth: Payer: Self-pay | Admitting: Neurology

## 2022-07-17 NOTE — Telephone Encounter (Signed)
Mychart

## 2022-07-24 ENCOUNTER — Encounter: Payer: Self-pay | Admitting: Neurology

## 2022-07-24 ENCOUNTER — Ambulatory Visit (INDEPENDENT_AMBULATORY_CARE_PROVIDER_SITE_OTHER): Payer: 59 | Admitting: Neurology

## 2022-07-24 VITALS — BP 117/78 | HR 82 | Ht 65.5 in | Wt 190.8 lb

## 2022-07-24 DIAGNOSIS — R7309 Other abnormal glucose: Secondary | ICD-10-CM

## 2022-07-24 DIAGNOSIS — E669 Obesity, unspecified: Secondary | ICD-10-CM | POA: Diagnosis not present

## 2022-07-24 DIAGNOSIS — R5383 Other fatigue: Secondary | ICD-10-CM | POA: Diagnosis not present

## 2022-07-24 DIAGNOSIS — E785 Hyperlipidemia, unspecified: Secondary | ICD-10-CM

## 2022-07-24 MED ORDER — ACETAZOLAMIDE 125 MG PO TABS: 125.0000 mg | ORAL_TABLET | Freq: Two times a day (BID) | ORAL | 6 refills | Status: DC

## 2022-07-24 MED ORDER — WEGOVY 0.5 MG/0.5ML ~~LOC~~ SOAJ: 0.5000 mg | SUBCUTANEOUS | 6 refills | Status: DC

## 2022-07-24 NOTE — Addendum Note (Signed)
Addended by: Sarina Ill B on: 07/24/2022 02:44 PM   Modules accepted: Level of Service

## 2022-07-24 NOTE — Patient Instructions (Signed)
Start Mounjaro if we can. If not call [phone] and get appointment even on the waitlist for the healthy weight and wellness center Start Acetazolamide for IDIOPATHIC INTRACRANIAL HYPERTENSION  Idiopathic Intracranial Hypertension  Idiopathic intracranial hypertension (IIH) is a condition that increases pressure around the brain. The fluid that surrounds the brain and spinal cord (cerebrospinal fluid, or CSF) increases and causes the pressure. Idiopathic means that the cause of this condition is not known. IIH affects the brain and spinal cord. If this condition is not treated, it can cause vision loss or blindness. What are the causes? The cause of this condition is not known. What increases the risk? The following factors may make you more likely to develop this condition: Being obese. Being a person who is female, between the ages of 74 and 86 years old, and who has not gone through menopause. Taking certain medicines, such as birth control, acne medicines, or steroids. What are the signs or symptoms? Symptoms of this condition include: Headaches. This is the most common symptom. Brief periods of total blindness. Double vision, blurred vision, or poor side (peripheral) vision. Pain in the shoulders or neck. Nausea and vomiting. A sound like rushing water or a pulsing sound within the ears (pulsatile tinnitus), or ringing in the ears. How is this diagnosed? This condition may be diagnosed based on: Your symptoms and medical history. Imaging tests of the brain, such as: CT scan. MRI. Magnetic resonance venogram (MRV) to check the veins. Diagnostic lumbar puncture. This is a procedure to remove and examine a sample of CSF. This procedure can determine whether your fluid pressure is too high. An eye exam to check for swelling or nerve damage in the eyes. How is this treated? Treatment for this condition depends on the symptoms. The goal of treatment is to decrease the pressure around  your brain. Common treatments include: Weight loss through healthy eating, salt restriction, and exercise, if you are overweight. Medicines to decrease the production of CSF and lower the pressure within your skull. Medicines to prevent or treat headaches. Other treatments may include: Surgery to place drains (shunts) in your brain to remove extra fluid. Lumbar puncture to remove extra CSF. Follow these instructions at home: If you are overweight or obese, work with your health care provider to lose weight. Take over-the-counter and prescription medicines only as told by your health care provider. Ask your health care provider if the medicine prescribed to you requires you to avoid driving or using machinery. Do not use any products that contain nicotine or tobacco. These products include cigarettes, chewing tobacco, and vaping devices, such as e-cigarettes. If you need help quitting, ask your health care provider. Keep all follow-up visits. Your health care provider will need to monitor you regularly. Contact a health care provider if: You have changes in your vision, such as: Double vision. Blurred vision. Poor peripheral vision. Get help right away if: You have any of the following symptoms and they get worse or do not get better: Headaches. Nausea. Vomiting. Sudden trouble seeing. This information is not intended to replace advice given to you by your health care provider. Make sure you discuss any questions you have with your health care provider. Document Revised: 09/27/2021 Document Reviewed: 09/06/2021 Elsevier Patient Education  [place]. Acetazolamide Tablets What is this medication? ACETAZOLAMIDE (a set a ZOLE a mide) reduces swelling related to heart disease. It may also be used to reduce swelling caused by medications. It helps your kidneys remove more fluid  and salt from your blood through the urine. It may also be used to treat conditions with increased pressure  of the eye, such as glaucoma. It can be used with other medications to prevent and control seizures in people with epilepsy. It can also be used to prevent or treat symptoms of altitude sickness. It works by increasing the amount of oxygen in your body. It belongs to a group of medications called diuretics. This medicine may be used for other purposes; ask your health care provider or pharmacist if you have questions. COMMON BRAND NAME(S): Diamox What should I tell my care team before I take this medication? They need to know if you have any of these conditions: Glaucoma Kidney disease Liver disease Low adrenal gland function Lung or breathing disease An unusual or allergic reaction to acetazolamide, other medications, foods, dyes, or preservatives Pregnant or trying to get pregnant Breast-feeding How should I use this medication? Take this medication by mouth. Take it as directed on the prescription label at the same time every day. You can take it with or without food. If it upsets your stomach, take it with food. Keep taking it unless your care team tells you to stop. Talk to your care team about the use of this medication in children. Special care may be needed. Overdosage: If you think you have taken too much of this medicine contact a poison control center or emergency room at once. NOTE: This medicine is only for you. Do not share this medicine with others. What if I miss a dose? If you miss a dose, take it as soon as you can. If it is almost time for your next dose, take only that dose. Do not take double or extra doses. What may interact with this medication? Do not take this medication with any of the following: Methazolamide This medication may also interact with the following: Aspirin and aspirin-like medications Cyclosporine Lithium Medication for diabetes Methenamine Other diuretics Phenytoin Primidone Quinidine Sodium bicarbonate Stimulant medications, such as  dextroamphetamine This list may not describe all possible interactions. Give your health care provider a list of all the medicines, herbs, non-prescription drugs, or dietary supplements you use. Also tell them if you smoke, drink alcohol, or use illegal drugs. Some items may interact with your medicine. What should I watch for while using this medication? Visit your care team for regular checks on your progress. Tell your care team if your symptoms do not start to get better or if they get worse. This medication may cause serious skin reactions. They can happen weeks to months after starting the medication. Contact your care team right away if you notice fevers or flu-like symptoms with a rash. The rash may be red or purple and then turn into blisters or peeling of the skin. Or, you might notice a red rash with swelling of the face, lips, or lymph nodes in your neck or under your arms. This medication may affect your coordination, reaction time, or judgment. Do not drive or operate machinery until you know how this medication affects you. Sit up or stand slowly to reduce the risk of dizzy or fainting spells. What side effects may I notice from receiving this medication? Side effects that you should report to your care team as soon as possible: Allergic reactions--skin rash, itching, hives, swelling of the face, lips, tongue, or throat Aplastic anemia--unusual weakness or fatigue, dizziness, headache, trouble breathing, increased bleeding or bruising High acid level--trouble breathing, unusual weakness or fatigue, confusion,  headache, fast or irregular heartbeat, nausea, vomiting Infection--fever, chills, cough, or sore throat Kidney stones--blood in the urine, pain or trouble passing urine, pain in the lower back or sides Liver injury--right upper belly pain, loss of appetite, nausea, light-colored stool, dark yellow or brown urine, yellowing skin or eyes, unusual weakness or fatigue Low potassium  level--muscle pain or cramps, unusual weakness or fatigue, fast or irregular heartbeat, constipation Redness, blistering, peeling, or loosening of the skin, including inside the mouth Side effects that usually do not require medical attention (report to your care team if they continue or are bothersome): Blurry vision Change in taste Loss of appetite Pain, tingling, or numbness in the hands or feet This list may not describe all possible side effects. Call your doctor for medical advice about side effects. You may report side effects to FDA at 1-800-FDA-1088. Where should I keep my medication? Keep out of the reach of children and pets. Store at room temperature between 20 and 25 degrees C (68 and 77 degrees F). Throw away any unused medication after the expiration date. NOTE: This sheet is a summary. It may not cover all possible information. If you have questions about this medicine, talk to your doctor, pharmacist, or health care provider.  2023 Elsevier/Gold Standard (2021-03-15 00:00:00)

## 2022-07-24 NOTE — Progress Notes (Signed)
[name]    Provider:  Dr Jaynee Eagles Requesting Provider: No ref. provider found Primary Care Provider:  Pcp, No  CC:  Papilledema  06/26/2022 addendum: Opening pressure of 36 is quite elevated and shows she has IDIOPATHIC INTRACRANIAL HYPERTENSION/ She can start taking the diamox I prescribed her and I have a follow up with ehr in 4 weeks thanks   Update 07/24/2022: She felt GREAT since the LP. She has significantly improved. She was a 36 and went down a 14. She is feeling great still. Some pulsating in the ears. Will start diamox 125 bid then increase to 250 mg bid if needed.   Patient complains of symptoms per HPI as well as the following symptoms: neck pain . Pertinent negatives and positives per HPI. All others negative   HPI:  Christine Snyder is a 27 y.o. female here as requested by No ref. provider found for Headaches. I reviewed EPIC, she has no PMHx in her chart. I reviewed ED notes from 03/18/2022 Dr. Tomi Bamberger she presented with headaches for several months. She saw a neurologist and ophthalmologist with [facility] for IDIOPATHIC INTRACRANIAL HYPERTENSION. She had an outpatient MRi never had an LP. Exam was reassuring and a migraine cocktail helped and she was discharged. I do not have her neurology notes. She went to her eye doctor, noticed [place] edema. Ringing in ears, daily headaches, pressure and more in the nack of the head and behind her ears with pressure, [facility], she wears glasses and she thought her blurry vision was just getting older, having floaters, episodes of blurry and bending over would go completely dark. Also hearing issues, fluid behind the ears, like in a seashell, neck pain. She is here from Christine Snyder she needs an IDIOPATHIC INTRACRANIAL HYPERTENSION workup. Getting [facility] notes. Moreso pressure. Especially in the back.   Reviewed notes, labs and imaging from outside physicians, which showed:  Reviewed imaging on CD: MRI brain w/wo,  orbits w/wo and MRV all unremarkable.   Per [employer] notes: For evaluation of optic nerves swelling OS complaining of moderately blurred double vision and pain, she has a history of strabismus but after surgery in 2000 and has not been as pronounced, complained of foot years, diplopia OU, ringing in ears, constant headache.  Patient was diagnosed with optic nerve head edema optic disc on the right as well as optic disc on the left.  Review of Systems: Patient complains of symptoms per HPI as well as the following symptoms headaches. Pertinent negatives and positives per HPI. All others negative.   Social History   Socioeconomic History   Marital status: Single    Spouse name: Not on file   Number of children: Not on file   Years of education: Not on file   Highest education level: Not on file  Occupational History   Not on file  Tobacco Use   Smoking status: Never    Passive exposure: Never   Smokeless tobacco: Never  Vaping Use   Vaping Use: Never used  Substance and Sexual Activity   Alcohol use: Not Currently   Drug use: Not Currently   Sexual activity: Yes    Birth control/protection: None  Other Topics Concern   Not on file  Social History Narrative   Caffiene no. 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 180 tablet 6   Semaglutide-Weight Management (WEGOVY) 0.5 MG/0.5ML SOAJ Inject 0.5 mg into the skin once a week. 2 mL 6   No current facility-administered medications for this visit.    Allergies as of 07/24/2022   (No Known Allergies)    Vitals: BP 117/78   Pulse 82   Ht 5' 5.5" (1.664 m)   Wt 190 lb 12.8 oz (86.5 kg)   BMI 31.27 kg/m  Last Weight:  Wt Readings from Last 1 Encounters:  07/24/22 190 lb 12.8 oz (86.5 kg)   Last Height:   Ht Readings from Last 1 Encounters:  07/24/22 5' 5.5" (1.664 m)   Physical exam: Exam: Gen: NAD, conversant, well nourised, obese, well groomed                     CV: RRR, no MRG. No Carotid Bruits. No peripheral edema, warm, nontender Eyes: Conjunctivae clear without exudates or hemorrhage  Neuro: Detailed Neurologic Exam  Speech:    Speech is normal; fluent and spontaneous with normal comprehension.  Cognition:    The patient is oriented to person, place, and time;     recent and remote memory intact;     language fluent;     normal attention, concentration,     fund of knowledge Cranial Nerves:    The pupils are equal, Snyder, and reactive to light.Optic nerves slightly blurred but look improved Visual fields are full to finger confrontation. Extraocular movements are intact. Trigeminal sensation is intact and the muscles of mastication are normal. The face is symmetric. The palate elevates in the midline. Hearing intact. Voice is normal. Shoulder shrug is normal. The tongue has normal motion without fasciculations.   Coordination:    Normal finger to nose and heel to shin. Normal rapid alternating movements.   Gait:    Heel-toe and tandem gait are normal.   Motor Observation:    No asymmetry, no atrophy, and no  involuntary movements noted. Tone:    Normal muscle tone.    Posture:    Posture is normal. normal erect    Strength:    Strength is V/V in the upper and lower limbs.      Sensation: intact to LT     Reflex Exam:  DTR's:    Deep tendon reflexes in the upper and lower extremities are normal bilaterally.   Toes:    The toes are downgoing bilaterally.   Clonus:    Clonus is absent.   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 Imaging unremarkable. Appears very mild.  06/26/2022 addendum: Opening pressure of 36 is quite elevated and  shows she has IDIOPATHIC INTRACRANIAL HYPERTENSION/ She can start taking the diamox I prescribed her and I have a follow up with ehr in 4 weeks thanks   Try wegovy. Needs weight loss for IDIOPATHIC INTRACRANIAL HYPERTENSION and obesity BMI > 31. Had tried and failed metformin. Also has been in a formal structured weight loss program for > 3 months and had NOT been able to lose 5% of her body weight with limiting calories, increasing exercise with great compliance.   New PCP [facility] - will send results and note   Lumbar puncture OP 36 Then start Diamox if elevated OP '125mg'$  bid Ophthalmologist Dr. Katy Fitch: she does NOT want to go back to [facility] - reviewed notes, papilledmea, will follow in 6 months    Weight loss is critical, discussed weight loss and risk of permanent vision loss  Orders Placed This Encounter  Procedures   CBC with Differential/Platelets   Comprehensive metabolic panel   TSH Rfx on Abnormal to Free T4   Hemoglobin A1c   Meds ordered this encounter  Medications   acetaZOLAMIDE (DIAMOX) 125 MG tablet    Sig: Take 1 tablet (125 mg total) by mouth 2 (two) times daily.    Dispense:  180 tablet    Refill:  6   Semaglutide-Weight Management (WEGOVY) 0.5 MG/0.5ML SOAJ    Sig: Inject 0.5 mg into the skin once a week.    Dispense:  2 mL     Refill:  6     For any acute change especially worsening headache or vision loss call 911 and proceed to ED  To prevent or relieve headaches, try the following: Cool Compress. Lie down and place a cool compress on your head.  Avoid headache triggers. If certain foods or odors seem to have triggered your migraines in the past, avoid them. A headache diary might help you identify triggers.  Include physical activity in your daily routine. Try a daily walk or other moderate aerobic exercise.  Manage stress. Find healthy ways to cope with the stressors, such as delegating tasks on your to-do list.  Practice relaxation techniques. Try deep breathing, yoga, massage and visualization.  Eat regularly. Eating regularly scheduled meals and maintaining a healthy diet might help prevent headaches. Also, drink plenty of fluids.  Follow a regular sleep schedule. Sleep deprivation might contribute to headaches Consider biofeedback. With this mind-body technique, you learn to control certain bodily functions -- such as muscle tension, heart rate and blood pressure -- to prevent headaches or reduce headache pain.    Proceed to emergency room if you experience new or worsening symptoms or symptoms do not resolve, if you have new neurologic symptoms or if headache is severe, or for any concerning symptom.   Provided education and documentation from American headache Society toolbox including articles on: pseudotumoer cerebri(IIH), chronic migraine medication overuse headache, chronic migraines, prevention of migraines and other headaches, behavioral and other nonpharmacologic treatments for headache.   Orders Placed This Encounter  Procedures   CBC with Differential/Platelets   Comprehensive metabolic panel   TSH Rfx on Abnormal to Free T4   Hemoglobin A1c   Meds ordered this encounter  Medications   acetaZOLAMIDE (DIAMOX) 125 MG tablet    Sig: Take 1 tablet (125 mg total) by mouth 2 (two) times daily.     Dispense:  180 tablet    Refill:  6   Semaglutide-Weight Management (WEGOVY) 0.5 MG/0.5ML SOAJ    Sig: Inject  0.5 mg into the skin once a week.    Dispense:  2 mL    Refill:  6    Cc: Lennox Grumbles, Larkin Ina, MD  [facility] [address] [place], [place] [address]  Phone [phone] Fax [phone]

## 2022-07-25 LAB — COMPREHENSIVE METABOLIC PANEL
ALT: 13 IU/L (ref 0–32)
AST: 11 IU/L (ref 0–40)
Albumin/Globulin Ratio: 1.7 (ref 1.2–2.2)
Albumin: 4.7 g/dL (ref 4.0–5.0)
Alkaline Phosphatase: 67 IU/L (ref 44–121)
BUN/Creatinine Ratio: 14 (ref 9–23)
BUN: 11 mg/dL (ref 6–20)
Bilirubin Total: 0.3 mg/dL (ref 0.0–1.2)
CO2: 23 mmol/L (ref 20–29)
Calcium: 9.7 mg/dL (ref 8.7–10.2)
Chloride: 104 mmol/L (ref 96–106)
Creatinine, Ser: 0.76 mg/dL (ref 0.57–1.00)
Globulin, Total: 2.8 g/dL (ref 1.5–4.5)
Glucose: 79 mg/dL (ref 70–99)
Potassium: 4.2 mmol/L (ref 3.5–5.2)
Sodium: 141 mmol/L (ref 134–144)
Total Protein: 7.5 g/dL (ref 6.0–8.5)
eGFR: 110 mL/min/{1.73_m2} (ref >59)

## 2022-07-25 LAB — CBC WITH DIFFERENTIAL/PLATELET
Basophils Absolute: 0 10*3/uL (ref 0.0–0.2)
Basos: 0 %
EOS (ABSOLUTE): 0.1 10*3/uL (ref 0.0–0.4)
Eos: 1 %
Hematocrit: 37.1 % (ref 34.0–46.6)
Hemoglobin: 11.9 g/dL (ref 11.1–15.9)
Immature Grans (Abs): 0 10*3/uL (ref 0.0–0.1)
Immature Granulocytes: 0 %
Lymphocytes Absolute: 1.5 10*3/uL (ref 0.7–3.1)
Lymphs: 20 %
MCH: 25.9 pg — ABNORMAL LOW (ref 26.6–33.0)
MCHC: 32.1 g/dL (ref 31.5–35.7)
MCV: 81 fL (ref 79–97)
Monocytes Absolute: 0.5 10*3/uL (ref 0.1–0.9)
Monocytes: 6 %
Neutrophils Absolute: 5.5 10*3/uL (ref 1.4–7.0)
Neutrophils: 73 %
Platelets: 355 10*3/uL (ref 150–450)
RBC: 4.59 x10E6/uL (ref 3.77–5.28)
RDW: 14.4 % (ref 11.7–15.4)
WBC: 7.6 10*3/uL (ref 3.4–10.8)

## 2022-07-25 LAB — TSH RFX ON ABNORMAL TO FREE T4: TSH: 1.69 u[IU]/mL (ref 0.450–4.500)

## 2022-07-25 LAB — HEMOGLOBIN A1C
Est. average glucose Bld gHb Est-mCnc: 117 mg/dL
Hgb A1c MFr Bld: 5.7 % — ABNORMAL HIGH (ref 4.8–5.6)

## 2022-09-18 NOTE — H&P (Signed)
  Subjective  Patient ID: Christine Snyder is a 27 y.o. female.     HPI Here for follow up discussion breast reduction. Current 36 DDD. Reports over year history neck and back pain, pain in breasts especially with exercise, and pain over lateral chest wall when lying down. States she has tried specialty fitted bras, ibuprofen, PT for over 3 month trial without relief. Reports itching beneath bresats.    Wt- reports she participated in weight loss program while still living in [place]. This included PT and diet modification. Reports no significant weight loss.    No prior MMG. MGM with breast ca in her 52s. Reports MGM also had cervical cancer in her 2s.   Seen by Neurology 07/2022 and diagnosis idiopathic intracranial hypertension given and Diamox prescribed. This was diagnosed as part of work up for HA. Weight loss recommended and semaglutide prescribed. Per patient she is not planning to start this medication.   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 Lives with roommate. Family in area to assist with post op care.   Review of Systems  Musculoskeletal:  Positive for back pain and neck pain.  Skin:        +itching  All other systems reviewed and are negative.     Objective  Physical Exam  Cardiovascular: Normal rate, regular rhythm and normal heart sounds.    Pulmonary/Chest Effort normal and breath sounds normal.    Skin   Fitzpatrick 6    +shoulder grooving Breasts: grade 3 ptosis bilateral , no masses SN to nipple R 35.5 L 35.5 cm BW R 21 L 21 cm Nipple to IMF R 13 L 15 cm       Assessment/Plan  Macromastia Chronic neck and back pain Obesity   If patient pursues use of semaglutide as prescribed by her Neurologist, recommend stabilization weight loss prior to surgery.   At her age, do not require MMG prior to surgery.    Chronic neck and back pain that has failed conservative measures in setting of macromastia. No other cause of back pain  or shoulder pain noted. There is a reasonable likelihood that the symptoms are primarily due to macromastia; breast reduction surgery has reasonable expectation to improve symptoms.   Reviewed reduction with anchor type scars, OP surgery, drains, post operative visits and limitations, recovery. Diminished sensation nipple and breast skin, risk of nipple loss, wound healing problems, asymmetry, incidental carcinoma, changes with wt gain/loss, aging, unacceptable cosmetic appearance reviewed. Reviewed changes with pregnancy and effect of surgery on ability to breast feed. Reviewed if NAC necrosis occurs, would not be able to breast feed from this breast. Cannot assure her cup size.   Additional risks including but not limited to bleeding seroma hematoma damage to adjacent structures blood clots in legs or lungs, need for additional procedures, infection reviewed. Completed ASPS consent for breast reduction.   Drain teaching completed. Rx for tramadol given.   Anticipate > 350 g reduction from each breast.

## 2022-09-21 ENCOUNTER — Encounter (HOSPITAL_BASED_OUTPATIENT_CLINIC_OR_DEPARTMENT_OTHER): Payer: Self-pay | Admitting: Plastic Surgery

## 2022-09-21 ENCOUNTER — Other Ambulatory Visit: Payer: Self-pay

## 2022-09-26 ENCOUNTER — Encounter (HOSPITAL_BASED_OUTPATIENT_CLINIC_OR_DEPARTMENT_OTHER)
Admission: RE | Admit: 2022-09-26 | Discharge: 2022-09-26 | Disposition: A | Discharge status: Home / Self Care | Payer: 59 | Source: Ambulatory Visit | Attending: Plastic Surgery | Admitting: Plastic Surgery

## 2022-09-26 DIAGNOSIS — Z01812 Encounter for preprocedural laboratory examination: Secondary | ICD-10-CM | POA: Insufficient documentation

## 2022-09-26 LAB — BASIC METABOLIC PANEL
Anion gap: 9 (ref 5–15)
BUN: 8 mg/dL (ref 6–20)
CO2: 24 mmol/L (ref 22–32)
Calcium: 8.9 mg/dL (ref 8.9–10.3)
Chloride: 105 mmol/L (ref 98–111)
Creatinine, Ser: 0.7 mg/dL (ref 0.44–1.00)
GFR, Estimated: >60 mL/min (ref >60)
Glucose, Bld: 87 mg/dL (ref 70–99)
Potassium: 3.6 mmol/L (ref 3.5–5.1)
Sodium: 138 mmol/L (ref 135–145)

## 2022-09-26 MED ORDER — CHLORHEXIDINE GLUCONATE CLOTH 2 % EX PADS: 6.0000 | MEDICATED_PAD | Freq: Once | CUTANEOUS | Status: DC

## 2022-09-26 NOTE — Progress Notes (Signed)

## 2022-09-27 ENCOUNTER — Other Ambulatory Visit (HOSPITAL_COMMUNITY): Payer: Self-pay

## 2022-09-28 ENCOUNTER — Telehealth: Payer: Self-pay | Admitting: Neurology

## 2022-09-28 NOTE — Telephone Encounter (Signed)
LVM and sent mychart msg informing pt of need to reschedule 12/18/22 appt - MD out 

## 2022-09-29 ENCOUNTER — Other Ambulatory Visit: Payer: Self-pay

## 2022-09-29 ENCOUNTER — Ambulatory Visit (HOSPITAL_BASED_OUTPATIENT_CLINIC_OR_DEPARTMENT_OTHER): Payer: 59 | Admitting: Anesthesiology

## 2022-09-29 ENCOUNTER — Encounter (HOSPITAL_BASED_OUTPATIENT_CLINIC_OR_DEPARTMENT_OTHER)
Admission: RE | Disposition: A | Discharge status: Home / Self Care | Payer: Self-pay | Source: Home / Self Care | Attending: Plastic Surgery

## 2022-09-29 ENCOUNTER — Ambulatory Visit (HOSPITAL_BASED_OUTPATIENT_CLINIC_OR_DEPARTMENT_OTHER)
Admission: RE | Admit: 2022-09-29 | Discharge: 2022-09-29 | Disposition: A | Discharge status: Home / Self Care | Payer: 59 | Attending: Plastic Surgery | Admitting: Plastic Surgery

## 2022-09-29 ENCOUNTER — Encounter (HOSPITAL_BASED_OUTPATIENT_CLINIC_OR_DEPARTMENT_OTHER): Payer: Self-pay | Admitting: Plastic Surgery

## 2022-09-29 DIAGNOSIS — N6082 Other benign mammary dysplasias of left breast: Secondary | ICD-10-CM | POA: Diagnosis not present

## 2022-09-29 DIAGNOSIS — Z803 Family history of malignant neoplasm of breast: Secondary | ICD-10-CM | POA: Diagnosis not present

## 2022-09-29 DIAGNOSIS — Z6829 Body mass index (BMI) 29.0-29.9, adult: Secondary | ICD-10-CM | POA: Insufficient documentation

## 2022-09-29 DIAGNOSIS — N6022 Fibroadenosis of left breast: Secondary | ICD-10-CM | POA: Diagnosis not present

## 2022-09-29 DIAGNOSIS — E669 Obesity, unspecified: Secondary | ICD-10-CM | POA: Diagnosis not present

## 2022-09-29 DIAGNOSIS — Z79899 Other long term (current) drug therapy: Secondary | ICD-10-CM | POA: Insufficient documentation

## 2022-09-29 DIAGNOSIS — G932 Benign intracranial hypertension: Secondary | ICD-10-CM | POA: Diagnosis not present

## 2022-09-29 DIAGNOSIS — N6081 Other benign mammary dysplasias of right breast: Secondary | ICD-10-CM | POA: Insufficient documentation

## 2022-09-29 DIAGNOSIS — N62 Hypertrophy of breast: Secondary | ICD-10-CM | POA: Diagnosis present

## 2022-09-29 DIAGNOSIS — Z8049 Family history of malignant neoplasm of other genital organs: Secondary | ICD-10-CM | POA: Insufficient documentation

## 2022-09-29 DIAGNOSIS — N6021 Fibroadenosis of right breast: Secondary | ICD-10-CM | POA: Insufficient documentation

## 2022-09-29 DIAGNOSIS — Z01818 Encounter for other preprocedural examination: Secondary | ICD-10-CM

## 2022-09-29 HISTORY — PX: BREAST REDUCTION SURGERY: SHX8

## 2022-09-29 HISTORY — DX: Benign intracranial hypertension: G93.2

## 2022-09-29 LAB — POCT PREGNANCY, URINE: Preg Test, Ur: NEGATIVE

## 2022-09-29 SURGERY — MAMMOPLASTY, REDUCTION
Anesthesia: General | Site: Breast | Laterality: Bilateral

## 2022-09-29 MED ORDER — KETAMINE HCL 50 MG/5ML IJ SOSY
PREFILLED_SYRINGE | INTRAMUSCULAR | Status: AC
  Filled 2022-09-29: qty 5

## 2022-09-29 MED ORDER — MEPERIDINE HCL 25 MG/ML IJ SOLN: 6.2500 mg | INTRAMUSCULAR | Status: DC | PRN

## 2022-09-29 MED ORDER — ROCURONIUM BROMIDE 100 MG/10ML IV SOLN
INTRAVENOUS | Status: DC | PRN
  Administered 2022-09-29: 80 mg via INTRAVENOUS

## 2022-09-29 MED ORDER — LIDOCAINE 2% (20 MG/ML) 5 ML SYRINGE
INTRAMUSCULAR | Status: AC
  Filled 2022-09-29: qty 5

## 2022-09-29 MED ORDER — DEXAMETHASONE SODIUM PHOSPHATE 10 MG/ML IJ SOLN
INTRAMUSCULAR | Status: AC
  Filled 2022-09-29: qty 1

## 2022-09-29 MED ORDER — ONDANSETRON HCL 4 MG/2ML IJ SOLN: 4.0000 mg | Freq: Once | INTRAMUSCULAR | Status: DC | PRN

## 2022-09-29 MED ORDER — SCOPOLAMINE 1 MG/3DAYS TD PT72
MEDICATED_PATCH | TRANSDERMAL | Status: AC
  Filled 2022-09-29: qty 1

## 2022-09-29 MED ORDER — KETOROLAC TROMETHAMINE 30 MG/ML IJ SOLN
INTRAMUSCULAR | Status: AC
  Filled 2022-09-29: qty 1

## 2022-09-29 MED ORDER — HYDROMORPHONE HCL 1 MG/ML IJ SOLN
INTRAMUSCULAR | Status: AC
  Filled 2022-09-29: qty 0.5

## 2022-09-29 MED ORDER — MIDAZOLAM HCL 2 MG/2ML IJ SOLN
INTRAMUSCULAR | Status: AC
  Filled 2022-09-29: qty 2

## 2022-09-29 MED ORDER — 0.9 % SODIUM CHLORIDE (POUR BTL) OPTIME
TOPICAL | Status: DC | PRN
  Administered 2022-09-29: 300 mL

## 2022-09-29 MED ORDER — KETOROLAC TROMETHAMINE 30 MG/ML IJ SOLN: 30.0000 mg | Freq: Once | INTRAMUSCULAR | Status: DC | PRN

## 2022-09-29 MED ORDER — LIDOCAINE HCL (CARDIAC) PF 100 MG/5ML IV SOSY
PREFILLED_SYRINGE | INTRAVENOUS | Status: DC | PRN
  Administered 2022-09-29: 60 mg via INTRAVENOUS

## 2022-09-29 MED ORDER — PROPOFOL 10 MG/ML IV BOLUS
INTRAVENOUS | Status: AC
  Filled 2022-09-29: qty 20

## 2022-09-29 MED ORDER — PROPOFOL 10 MG/ML IV BOLUS
INTRAVENOUS | Status: DC | PRN
Start: 2022-09-29 — End: 2022-09-29
  Administered 2022-09-29: 200 mg via INTRAVENOUS

## 2022-09-29 MED ORDER — EPHEDRINE 5 MG/ML INJ
INTRAVENOUS | Status: AC
  Filled 2022-09-29: qty 5

## 2022-09-29 MED ORDER — CEFAZOLIN SODIUM-DEXTROSE 2-4 GM/100ML-% IV SOLN
2.0000 g | INTRAVENOUS | Status: AC
  Administered 2022-09-29: 2 g via INTRAVENOUS

## 2022-09-29 MED ORDER — LACTATED RINGERS IV SOLN: INTRAVENOUS | Status: DC

## 2022-09-29 MED ORDER — CELECOXIB 200 MG PO CAPS
ORAL_CAPSULE | ORAL | Status: AC
  Filled 2022-09-29: qty 1

## 2022-09-29 MED ORDER — SUGAMMADEX SODIUM 200 MG/2ML IV SOLN
INTRAVENOUS | Status: DC | PRN
  Administered 2022-09-29: 200 mg via INTRAVENOUS

## 2022-09-29 MED ORDER — DEXMEDETOMIDINE HCL IN NACL 80 MCG/20ML IV SOLN
INTRAVENOUS | Status: AC
  Filled 2022-09-29: qty 20

## 2022-09-29 MED ORDER — ACETAMINOPHEN 500 MG PO TABS
1000.0000 mg | ORAL_TABLET | ORAL | Status: AC
  Administered 2022-09-29: 1000 mg via ORAL

## 2022-09-29 MED ORDER — SCOPOLAMINE 1 MG/3DAYS TD PT72
1.0000 | MEDICATED_PATCH | TRANSDERMAL | Status: DC
  Administered 2022-09-29: 1.5 mg via TRANSDERMAL

## 2022-09-29 MED ORDER — ACETAMINOPHEN 500 MG PO TABS
ORAL_TABLET | ORAL | Status: AC
  Filled 2022-09-29: qty 2

## 2022-09-29 MED ORDER — FENTANYL CITRATE (PF) 100 MCG/2ML IJ SOLN
INTRAMUSCULAR | Status: AC
  Filled 2022-09-29: qty 2

## 2022-09-29 MED ORDER — HYDROMORPHONE HCL 1 MG/ML IJ SOLN
INTRAMUSCULAR | Status: AC
  Filled 2022-09-29: qty 1

## 2022-09-29 MED ORDER — OXYCODONE HCL 5 MG/5ML PO SOLN: 5.0000 mg | Freq: Once | ORAL | Status: DC | PRN

## 2022-09-29 MED ORDER — ACETAMINOPHEN 10 MG/ML IV SOLN
1000.0000 mg | Freq: Once | INTRAVENOUS | Status: AC
  Administered 2022-09-29: 1000 mg via INTRAVENOUS

## 2022-09-29 MED ORDER — HYDROMORPHONE HCL 1 MG/ML IJ SOLN
INTRAMUSCULAR | Status: DC | PRN
  Administered 2022-09-29 (×2): .5 mg via INTRAVENOUS

## 2022-09-29 MED ORDER — MIDAZOLAM HCL 5 MG/5ML IJ SOLN
INTRAMUSCULAR | Status: DC | PRN
  Administered 2022-09-29: 2 mg via INTRAVENOUS

## 2022-09-29 MED ORDER — ONDANSETRON HCL 4 MG/2ML IJ SOLN
INTRAMUSCULAR | Status: AC
  Filled 2022-09-29: qty 2

## 2022-09-29 MED ORDER — DEXMEDETOMIDINE HCL IN NACL 80 MCG/20ML IV SOLN
INTRAVENOUS | Status: DC | PRN
  Administered 2022-09-29: 12 ug via INTRAVENOUS

## 2022-09-29 MED ORDER — PHENYLEPHRINE 80 MCG/ML (10ML) SYRINGE FOR IV PUSH (FOR BLOOD PRESSURE SUPPORT)
PREFILLED_SYRINGE | INTRAVENOUS | Status: AC
  Filled 2022-09-29: qty 10

## 2022-09-29 MED ORDER — ACETAMINOPHEN 10 MG/ML IV SOLN
INTRAVENOUS | Status: AC
  Filled 2022-09-29: qty 100

## 2022-09-29 MED ORDER — FENTANYL CITRATE (PF) 100 MCG/2ML IJ SOLN
INTRAMUSCULAR | Status: DC | PRN
  Administered 2022-09-29: 100 ug via INTRAVENOUS

## 2022-09-29 MED ORDER — CEFAZOLIN SODIUM-DEXTROSE 2-4 GM/100ML-% IV SOLN
INTRAVENOUS | Status: AC
  Filled 2022-09-29: qty 100

## 2022-09-29 MED ORDER — EPHEDRINE SULFATE (PRESSORS) 50 MG/ML IJ SOLN
INTRAMUSCULAR | Status: DC | PRN
  Administered 2022-09-29: 5 mg via INTRAVENOUS

## 2022-09-29 MED ORDER — KETOROLAC TROMETHAMINE 30 MG/ML IJ SOLN
30.0000 mg | Freq: Once | INTRAMUSCULAR | Status: AC
  Administered 2022-09-29: 30 mg via INTRAVENOUS

## 2022-09-29 MED ORDER — GABAPENTIN 300 MG PO CAPS
ORAL_CAPSULE | ORAL | Status: AC
  Filled 2022-09-29: qty 1

## 2022-09-29 MED ORDER — OXYCODONE HCL 5 MG PO TABS: 5.0000 mg | ORAL_TABLET | Freq: Once | ORAL | Status: DC | PRN

## 2022-09-29 MED ORDER — PHENYLEPHRINE HCL (PRESSORS) 10 MG/ML IV SOLN
INTRAVENOUS | Status: DC | PRN
  Administered 2022-09-29 (×3): 80 ug via INTRAVENOUS

## 2022-09-29 MED ORDER — AMISULPRIDE (ANTIEMETIC) 5 MG/2ML IV SOLN
10.0000 mg | Freq: Once | INTRAVENOUS | Status: AC | PRN
  Administered 2022-09-29: 10 mg via INTRAVENOUS

## 2022-09-29 MED ORDER — GABAPENTIN 300 MG PO CAPS
300.0000 mg | ORAL_CAPSULE | ORAL | Status: AC
  Administered 2022-09-29: 300 mg via ORAL

## 2022-09-29 MED ORDER — DEXAMETHASONE SODIUM PHOSPHATE 10 MG/ML IJ SOLN
INTRAMUSCULAR | Status: DC | PRN
  Administered 2022-09-29: 10 mg via INTRAVENOUS

## 2022-09-29 MED ORDER — AMISULPRIDE (ANTIEMETIC) 5 MG/2ML IV SOLN
INTRAVENOUS | Status: AC
  Filled 2022-09-29: qty 4

## 2022-09-29 MED ORDER — KETAMINE HCL 10 MG/ML IJ SOLN
INTRAMUSCULAR | Status: DC | PRN
  Administered 2022-09-29: 40 mg via INTRAVENOUS

## 2022-09-29 MED ORDER — BUPIVACAINE HCL (PF) 0.5 % IJ SOLN
INTRAMUSCULAR | Status: DC | PRN
  Administered 2022-09-29: 30 mL

## 2022-09-29 MED ORDER — HYDROMORPHONE HCL 1 MG/ML IJ SOLN
0.2500 mg | INTRAMUSCULAR | Status: DC | PRN
  Administered 2022-09-29 (×2): 0.5 mg via INTRAVENOUS

## 2022-09-29 MED ORDER — ROCURONIUM BROMIDE 10 MG/ML (PF) SYRINGE
PREFILLED_SYRINGE | INTRAVENOUS | Status: AC
  Filled 2022-09-29: qty 10

## 2022-09-29 MED ORDER — CELECOXIB 200 MG PO CAPS
200.0000 mg | ORAL_CAPSULE | ORAL | Status: AC
  Administered 2022-09-29: 200 mg via ORAL

## 2022-09-29 MED ORDER — ONDANSETRON HCL 4 MG/2ML IJ SOLN
INTRAMUSCULAR | Status: DC | PRN
  Administered 2022-09-29: 4 mg via INTRAVENOUS

## 2022-09-29 SURGICAL SUPPLY — 45 items
ADH SKN CLS APL DERMABOND .7 (GAUZE/BANDAGES/DRESSINGS) ×2
APL PRP STRL LF DISP 70% ISPRP (MISCELLANEOUS) ×2
BINDER BREAST XLRG (GAUZE/BANDAGES/DRESSINGS) IMPLANT
BINDER BREAST XXLRG (GAUZE/BANDAGES/DRESSINGS) IMPLANT
BLADE SURG 10 STRL SS (BLADE) ×4 IMPLANT
BNDG GAUZE DERMACEA FLUFF 4 (GAUZE/BANDAGES/DRESSINGS) ×2 IMPLANT
BNDG GZE DERMACEA 4 6PLY (GAUZE/BANDAGES/DRESSINGS) ×2
CANISTER SUCT 1200ML W/VALVE (MISCELLANEOUS) ×1 IMPLANT
CHLORAPREP W/TINT 26 (MISCELLANEOUS) ×2 IMPLANT
COVER BACK TABLE 60X90IN (DRAPES) ×1 IMPLANT
COVER MAYO STAND STRL (DRAPES) ×1 IMPLANT
DERMABOND ADVANCED .7 DNX12 (GAUZE/BANDAGES/DRESSINGS) ×2 IMPLANT
DRAIN CHANNEL 15F RND FF W/TCR (WOUND CARE) IMPLANT
DRAPE TOP ARMCOVERS (MISCELLANEOUS) ×1 IMPLANT
DRAPE U-SHAPE 76X120 STRL (DRAPES) ×1 IMPLANT
DRAPE UTILITY XL STRL (DRAPES) ×1 IMPLANT
ELECT COATED BLADE 2.86 ST (ELECTRODE) ×1 IMPLANT
ELECT REM PT RETURN 9FT ADLT (ELECTROSURGICAL) ×1
ELECTRODE REM PT RTRN 9FT ADLT (ELECTROSURGICAL) ×1 IMPLANT
EVACUATOR SILICONE 100CC (DRAIN) IMPLANT
GAUZE PAD ABD 8X10 STRL (GAUZE/BANDAGES/DRESSINGS) ×2 IMPLANT
GLOVE BIO SURGEON STRL SZ 6 (GLOVE) ×2 IMPLANT
GOWN STRL REUS W/ TWL LRG LVL3 (GOWN DISPOSABLE) ×2 IMPLANT
GOWN STRL REUS W/TWL LRG LVL3 (GOWN DISPOSABLE) ×2
NDL HYPO 25X1 1.5 SAFETY (NEEDLE) ×1 IMPLANT
NEEDLE HYPO 25X1 1.5 SAFETY (NEEDLE) ×1 IMPLANT
NS IRRIG 1000ML POUR BTL (IV SOLUTION) ×1 IMPLANT
PACK BASIN DAY SURGERY FS (CUSTOM PROCEDURE TRAY) ×1 IMPLANT
PENCIL SMOKE EVACUATOR (MISCELLANEOUS) ×1 IMPLANT
PIN SAFETY STERILE (MISCELLANEOUS) ×1 IMPLANT
SHEET MEDIUM DRAPE 40X70 STRL (DRAPES) ×2 IMPLANT
SLEEVE SCD COMPRESS KNEE MED (STOCKING) ×1 IMPLANT
SPONGE T-LAP 18X18 ~~LOC~~+RFID (SPONGE) ×3 IMPLANT
STAPLER VISISTAT 35W (STAPLE) ×1 IMPLANT
SUT ETHILON 2 0 FS 18 (SUTURE) IMPLANT
SUT MNCRL AB 4-0 PS2 18 (SUTURE) IMPLANT
SUT VIC AB 3-0 PS1 18 (SUTURE) ×8
SUT VIC AB 3-0 PS1 18XBRD (SUTURE) IMPLANT
SUT VIC AB 4-0 PS2 18 (SUTURE) IMPLANT
SYR BULB IRRIG 60ML STRL (SYRINGE) ×1 IMPLANT
SYR CONTROL 10ML LL (SYRINGE) ×1 IMPLANT
TOWEL GREEN STERILE FF (TOWEL DISPOSABLE) ×2 IMPLANT
TUBE CONNECTING 20X1/4 (TUBING) ×1 IMPLANT
UNDERPAD 30X36 HEAVY ABSORB (UNDERPADS AND DIAPERS) ×2 IMPLANT
YANKAUER SUCT BULB TIP NO VENT (SUCTIONS) ×1 IMPLANT

## 2022-09-29 NOTE — Op Note (Signed)
Operative Note   DATE OF OPERATION: 5.17.24  LOCATION: [facility]-outpatient  SURGICAL DIVISION: Plastic Surgery  PREOPERATIVE DIAGNOSES:  1. Macromastia 2. Chronic neck and back pain  POSTOPERATIVE DIAGNOSES:  same  PROCEDURE:  Bilateral breast reduction  SURGEON: Glenna Fellows MD MBA  ASSISTANT: none  ANESTHESIA:  General.   EBL: 50 ml  COMPLICATIONS: None immediate.   INDICATIONS FOR PROCEDURE:  The patient, Christine Snyder, is a 27 y.o. female born on June 06, 1995, is here for treatment chronic neck and back pain in setting macromastia that has failed conservative measures.    FINDINGS: Right reduction 596 g Left reduction  644 g  DESCRIPTION OF PROCEDURE:  The patient was marked standing in the preoperative area to mark sternal notch, chest midline, anterior axillary lines, inframammary folds. The location of new nipple areolar complex was marked at level of on inframammary fold on anterior surface breast by palpation. This was marked symmetric over bilateral breasts. With aid of Wise pattern marker, location of new nipple areolar complex and vertical limbs (8 cm) were marked by displacement of breasts along meridian. The patient was taken to the operating room. SCDs were placed and IV antibiotics were given. The patient's operative site was prepped and draped in a sterile fashion. A time out was performed and all information was confirmed to be correct.     I began on left breast. Over left breast, superior medial pedicle marked and nipple areolar complex incised with 45 mm diameter marker. Pedicle deepithlialized and developed to chest wall. Breast tissue resected over lower pole. Medial and lateral flaps developed. Additional superior and lateral breast tissue excised. Breast tailor tacked closed.    I then directed attention to right breast where superior medial pedicle designed. NAC incised with 45 mm diameter marker. The pedicle was deepithelialized. Pedicle  developed to chest wall. Breast tissue resected over lower pole. Medial and lateral flaps developed. Additional superior and lateral breast tissue excised. Breast tailor tacked closed. Patient assessed for symmetry. Breast cavities irrigated and hemostasis obtained. Local anesthetic infiltrated throughout each breast. 15 Fr JP placed in each breast and secured with 2-0 nylon. Closure completed bilateral with 3-0 vicryl to approximate dermis along inframammary fold and vertical limb. NAC inset with 3-0 vicryl in dermis. Skin closure completed with 4-0 monocryl subcuticular throughout. Tissue adhesive applied. Dry dressing and breast binder applied.   The patient was allowed to wake from anesthesia, extubated and taken to the recovery room in satisfactory condition.   SPECIMENS: right and left breast reduction  DRAINS: 15 Fr JP in right and left breast  Glenna Fellows, MD [facility]  Office/ physician access line after hours [phone]

## 2022-09-29 NOTE — Transfer of Care (Signed)
Immediate Anesthesia Transfer of Care Note  Patient: Vinette Spada  Procedure(s) Performed: MAMMARY REDUCTION  (BREAST) (Bilateral: Breast)  Patient Location: PACU  Anesthesia Type:General  Level of Consciousness: awake, alert , and oriented  Airway & Oxygen Therapy: Patient Spontanous Breathing and Patient connected to face mask oxygen  Post-op Assessment: Report given to RN and Post -op Vital signs reviewed and stable  Post vital signs: Reviewed and stable  Last Vitals:  Vitals Value Taken Time  BP 121/68 09/29/22 1459  Temp    Pulse 87 09/29/22 1500  Resp 17 09/29/22 1500  SpO2 95 % 09/29/22 1500  Vitals shown include unvalidated device data.  Last Pain:  Vitals:   09/29/22 1008  TempSrc: Oral  PainSc: 0-No pain         Complications: No notable events documented.

## 2022-09-29 NOTE — Anesthesia Postprocedure Evaluation (Signed)
Anesthesia Post Note  Patient: Psychologist, counselling  Procedure(s) Performed: MAMMARY REDUCTION  (BREAST) (Bilateral: Breast)     Patient location during evaluation: PACU Anesthesia Type: General Level of consciousness: awake and alert, oriented and patient cooperative Pain management: pain level controlled Vital Signs Assessment: post-procedure vital signs reviewed and stable Respiratory status: spontaneous breathing, nonlabored ventilation and respiratory function stable Cardiovascular status: blood pressure returned to baseline and stable Postop Assessment: no apparent nausea or vomiting Anesthetic complications: no   No notable events documented.  Last Vitals:  Vitals:   09/29/22 1530 09/29/22 1545  BP:  (!) 112/57  Pulse:  83  Resp:  14  Temp:    SpO2: 98% 98%    Last Pain:  Vitals:   09/29/22 1530  TempSrc:   PainSc: 5    Pain Goal:                   Christine Snyder

## 2022-09-29 NOTE — Discharge Instructions (Signed)
No tylenol or ibuprofen until 4:10 p.m.  Post Anesthesia Home Care Instructions  Activity: Get plenty of rest for the remainder of the day. A responsible individual must stay with you for 24 hours following the procedure.  For the next 24 hours, DO NOT: -Drive a car -Advertising copywriter -Drink alcoholic beverages -Take any medication unless instructed by your physician -Make any legal decisions or sign important papers.  Meals: Start with liquid foods such as gelatin or soup. Progress to regular foods as tolerated. Avoid greasy, spicy, heavy foods. If nausea and/or vomiting occur, drink only clear liquids until the nausea and/or vomiting subsides. Call your physician if vomiting continues.  Special Instructions/Symptoms: Your throat may feel dry or sore from the anesthesia or the breathing tube placed in your throat during surgery. If this causes discomfort, gargle with warm salt water. The discomfort should disappear within 24 hours.  If you had a scopolamine patch placed behind your ear for the management of post- operative nausea and/or vomiting:  1. The medication in the patch is effective for 72 hours, after which it should be removed.  Wrap patch in a tissue and discard in the trash. Wash hands thoroughly with soap and water. 2. You may remove the patch earlier than 72 hours if you experience unpleasant side effects which may include dry mouth, dizziness or visual disturbances. 3. Avoid touching the patch. Wash your hands with soap and water after contact with the patch.       JP Drain [employer] this sheet to all of your post-operative appointments while you have your drains. Please measure your drains by CC's or ML's. Make sure you drain and measure your JP Drains 2 or 3 times per day. At the end of each day, add up totals for the left side and add up totals for the right side.    ( 9 am )     ( 3 pm )        ( 9 pm )                Date L  R  L  R  L  R  Total L/R

## 2022-09-29 NOTE — Anesthesia Procedure Notes (Signed)
Procedure Name: Intubation Date/Time: 09/29/2022 11:48 AM  Performed by: Thornell Mule, CRNAPre-anesthesia Checklist: Patient identified, Emergency Drugs available, Suction available and Patient being monitored Patient Re-evaluated:Patient Re-evaluated prior to induction Oxygen Delivery Method: Circle system utilized Preoxygenation: Pre-oxygenation with 100% oxygen Induction Type: IV induction Ventilation: Mask ventilation without difficulty Laryngoscope Size: Mac and 3 Grade View: Grade I Tube type: Oral Tube size: 7.0 mm Number of attempts: 1 Airway Equipment and Method: Stylet Placement Confirmation: ETT inserted through vocal cords under direct vision, positive ETCO2 and breath sounds checked- equal and bilateral Tube secured with: Tape Dental Injury: Teeth and Oropharynx as per pre-operative assessment

## 2022-09-29 NOTE — Anesthesia Preprocedure Evaluation (Addendum)
Anesthesia Evaluation  Patient identified by MRN, date of birth, ID band Patient awake    Reviewed: Allergy & Precautions, NPO status , Patient's Chart, lab work & pertinent test results  Airway Mallampati: II  TM Distance: >3 FB Neck ROM: Full    Dental no notable dental hx. (+) Teeth Intact, Dental Advisory Given   Pulmonary neg pulmonary ROS   Pulmonary exam normal breath sounds clear to auscultation       Cardiovascular negative cardio ROS Normal cardiovascular exam Rhythm:Regular Rate:Normal     Neuro/Psych  Headaches IIH  negative psych ROS   GI/Hepatic negative GI ROS, Neg liver ROS,,,  Endo/Other  negative endocrine ROS    Renal/GU negative Renal ROS  negative genitourinary   Musculoskeletal negative musculoskeletal ROS (+)    Abdominal   Peds  Hematology negative hematology ROS (+)   Anesthesia Other Findings Wegovy LD:   Reproductive/Obstetrics negative OB ROS                             Anesthesia Physical Anesthesia Plan  ASA: 2  Anesthesia Plan: General   Post-op Pain Management: Toradol IV (intra-op)*, Tylenol PO (pre-op)*, Ketamine IV* and Dilaudid IV   Induction: Intravenous  PONV Risk Score and Plan: 3 and Ondansetron, Dexamethasone, Treatment may vary due to age or medical condition and Midazolam  Airway Management Planned: Oral ETT  Additional Equipment: None  Intra-op Plan:   Post-operative Plan: Extubation in OR  Informed Consent: I have reviewed the patients History and Physical, chart, labs and discussed the procedure including the risks, benefits and alternatives for the proposed anesthesia with the patient or authorized representative who has indicated his/her understanding and acceptance.     Dental advisory given  Plan Discussed with: CRNA  Anesthesia Plan Comments:        Anesthesia Quick Evaluation

## 2022-09-29 NOTE — Interval H&P Note (Signed)
History and Physical Interval Note:  09/29/2022 11:12 AM  Christine Snyder  has presented today for surgery, with the diagnosis of macromastia, chronic neck and back pain.  The various methods of treatment have been discussed with the patient and family. After consideration of risks, benefits and other options for treatment, the patient has consented to  Procedure(s): MAMMARY REDUCTION  (BREAST) (Bilateral) as a surgical intervention.  The patient's history has been reviewed, patient examined, no change in status, stable for surgery.  I have reviewed the patient's chart and labs.  Questions were answered to the patient's satisfaction.     [name]

## 2022-10-02 ENCOUNTER — Encounter (HOSPITAL_BASED_OUTPATIENT_CLINIC_OR_DEPARTMENT_OTHER): Payer: Self-pay | Admitting: Plastic Surgery

## 2022-10-02 LAB — SURGICAL PATHOLOGY

## 2022-10-04 ENCOUNTER — Other Ambulatory Visit (HOSPITAL_COMMUNITY): Payer: Self-pay

## 2022-10-04 ENCOUNTER — Telehealth: Payer: Self-pay

## 2022-10-04 NOTE — Telephone Encounter (Signed)
Patient Advocate Encounter   Received notification from [employer] that prior authorization is required for [facility] 0.5MG /0.5ML auto-injectors   Submitted: 10-04-2022 Key [id number]  Status is pending

## 2022-10-06 NOTE — Telephone Encounter (Signed)
Patient Advocate Encounter  Prior Authorization for [employer] 0.5MG /0.5ML auto-injectors has been approved.    PA# [id number] Insurance [employer] Electronic PA Form Effective dates: 10/04/2022 through 05/02/2023

## 2022-10-24 ENCOUNTER — Encounter: Payer: Self-pay | Admitting: Neurology

## 2022-10-26 NOTE — Progress Notes (Signed)
Chief Complaint  Patient presents with   Follow-up    Pt in room 1. Here for IIH. Pt said she doing well after having spinal tap in Feb. Pt said ringing noise in right ear mainly. Pt said she started seeing eye floaters started in last 2 weeks noticed mainly left eye, comes and goes.    HISTORY OF PRESENT ILLNESS:  10/30/22 ALL:  Christine Snyder is a 27 y.o. female here today for follow up for IIH.  She was last seen by Dr Lucia Gaskins 07/2022. LP showed opening pressure 36, closing 14cmH20. She was started on Diamox 125mg  BID. She reports headaches and "swooshing" sound in right ear had resolved following LP. About a month ago right ear started bothering her again. Dose was increased to 250mg  BID last week. She has not noticed much difference in ear. Floaters come and go. No vision loss. She does note significant tingling in fingertips. She tolerates med, otherwise.   Dr Dione Booze evaluated prior to LP but not since. She has a follow up scheduled 12/2022. She was approved for [facility]. She has been hesitant to take it. She has lost about 20lbs by changing her diet. She recently had a breast reduction.    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 She has significantly improved. She was a 36 and went down a 14. She is feeling great still. Some pulsating in the ears. Will start diamox 125 bid then increase to 250 mg bid if needed.    Patient complains of symptoms per HPI as well as the following symptoms: neck pain . Pertinent negatives and positives per HPI. All others negative     HPI:  Christine Snyder is a 27 y.o. female here as requested by No ref. provider found for Headaches. I reviewed EPIC, she has no PMHx in her chart. I reviewed  ED notes from 03/18/2022 Dr. Lynelle Doctor she presented with headaches for several months. She saw a neurologist and ophthalmologist with concrns for IDIOPATHIC INTRACRANIAL HYPERTENSION. She had an outpatient MRi never had an LP. Exam was reassuring and a migraine cocktail helped and she was discharged. I do not have her neurology notes. She went to her eye doctor, noticed ONH edema. Ringing in ears, daily headaches, pressure and more in the nack of the head and behind her ears with pressure, [facility], she wears glasses and she thought her blurry vision was just getting older, having floaters, episodes of blurry and bending over would go completely dark. Also hearing issues, fluid behind the ears, like in a seashell, neck pain. She is here from [facility] she needs an IDIOPATHIC INTRACRANIAL HYPERTENSION workup. Getting [facility] notes. Moreso pressure. Especially in the back.    Reviewed notes, labs and imaging from outside physicians, which showed:   Reviewed imaging on CD: MRI brain w/wo, orbits w/wo and MRV all unremarkable.    Per [employer] notes: For evaluation of optic nerves swelling OS complaining of moderately blurred double vision and pain, she has a history of strabismus but after surgery in 2000 and has not been as pronounced, complained of foot years, diplopia OU, ringing in ears, constant headache.  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   REVIEW OF SYSTEMS: Out of a complete 14 system review of symptoms, the patient complains only of the following symptoms, ringing in right ear, floaters and all other reviewed systems are negative.   ALLERGIES: No Known Allergies   HOME MEDICATIONS: Outpatient Medications Prior to Visit  Medication Sig Dispense Refill   acetaZOLAMIDE (DIAMOX) 125 MG tablet Take 1 tablet (125 mg total) by mouth 2 (two) times daily. 180 tablet 6   Semaglutide-Weight Management (WEGOVY)  0.5 MG/0.5ML SOAJ Inject 0.5 mg into the skin once a week. (Patient not taking: Reported on 10/30/2022) 2 mL 6   No facility-administered medications prior to visit.     PAST MEDICAL HISTORY: Past Medical History:  Diagnosis Date   Headache    Idiopathic intracranial hypertension      PAST SURGICAL HISTORY: Past Surgical History:  Procedure Laterality Date   BREAST REDUCTION SURGERY Bilateral 09/29/2022   Procedure: MAMMARY REDUCTION  (BREAST);  Surgeon: Glenna Fellows, MD;  Location: [facility];  Service: Plastics;  Laterality: Bilateral;   EYE SURGERY Right    27yrs old   LUMBAR PUNCTURE     2024     FAMILY HISTORY: Family History  Problem Relation Age of Onset   Hypertension Mother    Migraines Neg Hx      SOCIAL HISTORY: Social History   Socioeconomic History   Marital status: Single    Spouse name: Not on file   Number of children: Not on file   Years of education: Not on file   Highest education level: Not on file  Occupational History   Not on file  Tobacco Use   Smoking status: Never    Passive exposure: Never   Smokeless tobacco: Never  Vaping Use   Vaping Use: Never used  Substance and Sexual Activity   Alcohol use: Not Currently   Drug use: Not Currently   Sexual activity: Yes    Birth control/protection: None  Other Topics Concern   Not on file  Social History Narrative   Caffiene no. Soda sprite-occasional   Working: remote:  call center   Social Determinants of Corporate investment banker Strain: Not on [employer] Insecurity: Not on file  Transportation Needs: Not on file  Physical Activity: Not on file  Stress: Not on file  Social Connections: Not on file  Intimate Partner Violence: Not on file     PHYSICAL EXAM  Vitals:   10/30/22 0902  BP: 105/70  Pulse: 85  Weight: 172 lb (78 kg)  Height: 5\' 5"  (1.651 m)   Body mass index is 28.62 kg/m.  Generalized: Well developed, in no acute  distress  Cardiology: normal rate and rhythm, no murmur auscultated  Respiratory: clear to auscultation bilaterally    Neurological examination  Mentation: Alert oriented to time, place, history taking. Follows all commands speech and language fluent Cranial nerve II-XII: Pupils were equal round reactive to light. Extraocular movements were full, visual field were full on confrontational test. Facial sensation and strength were normal. Head turning and shoulder shrug  were normal and symmetric. Motor: The motor testing reveals 5 over 5 strength of all 4 extremities. Good symmetric motor tone is noted throughout.  Gait and station: Gait is normal.    DIAGNOSTIC DATA (LABS, IMAGING, TESTING) - I reviewed patient records, labs, notes, testing and imaging myself where available.  Lab Results  Component Value Date  WBC 7.6 07/24/2022   HGB 11.9 07/24/2022   HCT 37.1 07/24/2022   MCV 81 07/24/2022   PLT 355 07/24/2022      Component Value Date/Time   NA 138 09/26/2022 1301   NA 141 07/24/2022 1229   K 3.6 09/26/2022 1301   CL 105 09/26/2022 1301   CO2 24 09/26/2022 1301   GLUCOSE 87 09/26/2022 1301   BUN 8 09/26/2022 1301   BUN 11 07/24/2022 1229   CREATININE 0.70 09/26/2022 1301   CALCIUM 8.9 09/26/2022 1301   PROT 7.5 07/24/2022 1229   ALBUMIN 4.7 07/24/2022 1229   AST 11 07/24/2022 1229   ALT 13 07/24/2022 1229   ALKPHOS 67 07/24/2022 1229   BILITOT 0.3 07/24/2022 1229   GFRNONAA >60 09/26/2022 1301   No results found for: "CHOL", "HDL", "LDLCALC", "LDLDIRECT", "TRIG", "CHOLHDL" Lab Results  Component Value Date   HGBA1C 5.7 (H) 07/24/2022   No results found for: "VITAMINB12" Lab Results  Component Value Date   TSH 1.690 07/24/2022        No data to display               No data to display           ASSESSMENT AND PLAN  27 y.o. year old female  has a past medical history of Headache and Idiopathic intracranial hypertension. here with    IIH  (idiopathic intracranial hypertension)  Christine Snyder is doing well. She is tolerating acetazolamide. We will continue 250mg  BID for now but will anticipate increasing dose to 500mg  BID in the future, pending next eye exam. Side effects reviewed. She has lost 20lbs with lifestyle changes. She will hold [facility] for now but may reconsider in the future. Continued healthy lifestyle habits encouraged. She will follow up with PCP as directed. She will return to see me in 6 months, sooner if needed. She verbalizes understanding and agreement with this plan.   No orders of the defined types were placed in this encounter.    Meds ordered this encounter  Medications   acetaZOLAMIDE (DIAMOX) 250 MG tablet    Sig: Take 1 tablet (250 mg total) by mouth 2 (two) times daily.    Dispense:  180 tablet    Refill:  1    Order Specific Question:   Supervising Provider    Answer:   Anson Fret [[id number]]     Shawnie Dapper, MSN, FNP-C 10/30/2022, 9:38 AM  [facility] [address], [address] [place], [place] [address] [phone]

## 2022-10-26 NOTE — Telephone Encounter (Signed)
FYI phone room, pt was already moved up and will see Amy NP next week. No need to try to look for something sooner.

## 2022-10-26 NOTE — Telephone Encounter (Signed)
Called pt. Scheduled her to see Amy on 6/17 @ 9 am. (Per Dr. Lucia Gaskins for soon appointment. )

## 2022-10-30 ENCOUNTER — Encounter: Payer: Self-pay | Admitting: Family Medicine

## 2022-10-30 ENCOUNTER — Ambulatory Visit (INDEPENDENT_AMBULATORY_CARE_PROVIDER_SITE_OTHER): Payer: 59 | Admitting: Family Medicine

## 2022-10-30 VITALS — BP 105/70 | HR 85 | Ht 65.0 in | Wt 172.0 lb

## 2022-10-30 DIAGNOSIS — G932 Benign intracranial hypertension: Secondary | ICD-10-CM

## 2022-10-30 MED ORDER — ACETAZOLAMIDE 250 MG PO TABS: 250.0000 mg | ORAL_TABLET | Freq: Two times a day (BID) | ORAL | 1 refills | Status: DC

## 2022-10-30 NOTE — Patient Instructions (Signed)
Below is our plan:  We will continue Diamox (acetazolamide) 250mg  twice daily for now. We will consider increasing dose to 500mg  BID in the future. Please monitor headache, any vision changes and ringing in ears. Keep appt with Dr Dione Booze in 12/2022.   Please make sure you are staying well hydrated. I recommend 50-60 ounces daily. Well balanced diet and regular exercise encouraged. Consistent sleep schedule with 6-8 hours recommended.   Please continue follow up with care team as directed.   Follow up with me in 6 months   You may receive a survey regarding today's visit. I encourage you to leave honest feed back as I do use this information to improve patient care. Thank you for seeing me today!

## 2022-10-31 NOTE — Telephone Encounter (Signed)
Gave completed/signed FMLA back to medical records to process for pt. 

## 2022-12-18 ENCOUNTER — Ambulatory Visit: Payer: 59 | Admitting: Neurology

## 2023-01-02 ENCOUNTER — Encounter: Payer: Self-pay | Admitting: Neurology

## 2023-01-04 NOTE — Telephone Encounter (Signed)
Dates requested below by patient have been added to form. MD signature obtained and form sent to medical records for processing.

## 2023-01-08 ENCOUNTER — Ambulatory Visit: Payer: 59 | Admitting: Neurology

## 2023-01-16 NOTE — Telephone Encounter (Signed)
FMLA form completed, signed, and sent to medical records.

## 2023-04-29 ENCOUNTER — Telehealth: Payer: Self-pay | Admitting: Neurology

## 2023-04-29 ENCOUNTER — Encounter: Payer: Self-pay | Admitting: Neurology

## 2023-04-29 NOTE — Telephone Encounter (Signed)
I just tried calling patient on her listed number and her backup's number.  Both of them are unreachable.  I also sent her a MyChart message.  Please give her a call and see how she is doing if she needs to see me this month I can add her in anytime this month at 1230 but if she is willing to wait until next month or if Amy has an opening within an acceptable timeframe then that would be great.  Please let me know if you need me otherwise.  I will be out tomorrow morning because of illness at least until I see primary care at 1130  appointment.  Thank you.

## 2023-04-30 ENCOUNTER — Ambulatory Visit: Payer: Self-pay | Admitting: Neurology

## 2023-04-30 NOTE — Telephone Encounter (Signed)
Called pt to make sure she received Dr. Trevor Mace messages. I confirmed that her appointment today is cancelled, pt states she will call us back to r/s.

## 2023-06-19 ENCOUNTER — Telehealth: Payer: Self-pay | Admitting: Family Medicine

## 2023-06-19 NOTE — Progress Notes (Signed)
 Chief Complaint  Patient presents with   Room 1    Pt is here Alone. Pt states that her headaches are back. Pt states that she is seeing floaters. Pt states that the swooshing in her right ear is back. Pt needs refill on Diamox .     HISTORY OF PRESENT ILLNESS:  06/20/23 ALL:  Christine Snyder returns for follow up for IIH. She was last seen 10/2022 and doing well on acetazolamide  250mg  BID. We planned to increase dose to 500mg  BID following eye exam scheduled 12/2022. Since, she reports doing well until recently. She stopped acetazolamide  about 3-4 months ago as she was feeling well. She reports that over the past few weeks, headaches have returned. She is hearing a swooshing sound in both ears. She has floaters in right eye. She has not updated eye exam. She denies vision loss. She has gained about 10lbs.  10/30/2022 ALL:  Christine Snyder is a 28 y.o. female here today for follow up for IIH.  She was last seen by Dr Ines 07/2022. LP showed opening pressure 36, closing 14cmH20. She was started on Diamox  125mg  BID. She reports headaches and swooshing sound in right ear had resolved following LP. About a month ago right ear started bothering her again. Dose was increased to 250mg  BID last week. She has not noticed much difference in ear. Floaters come and go. No vision loss. She does note significant tingling in fingertips. She tolerates med, otherwise.   Dr Octavia evaluated prior to LP but not since. She has a follow up scheduled 12/2022. She was approved for Wegovy . She has been hesitant to take it. She has lost about 20lbs by changing her diet. She recently had a breast reduction.    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 She has  significantly improved. She was a 36 and went down a 14. She is feeling great still. Some pulsating in the ears. Will start diamox  125 bid then increase to 250 mg bid if needed.    Patient complains of symptoms per HPI as well as the following symptoms: neck pain . Pertinent negatives and positives per HPI. All others negative     HPI:  Christine Snyder is a 28 y.o. female here as requested by No ref. provider found for Headaches. I reviewed EPIC, she has no PMHx in her chart. I reviewed ED notes from 03/18/2022 Dr. Randol she presented with headaches for several months. She saw a neurologist and ophthalmologist with [facility] for IDIOPATHIC INTRACRANIAL HYPERTENSION. She had an outpatient MRi never had an LP. Exam was reassuring and a migraine cocktail helped and she was discharged. I do not have her neurology notes. She went to her eye doctor, noticed ONH edema. Ringing in ears, daily headaches, pressure and more in the nack of the head and behind her ears with pressure, [facility], she wears glasses and she thought her blurry vision was just getting older, having floaters, episodes of blurry and bending over would go completely dark. Also hearing issues, fluid behind the ears, like in a seashell, neck pain. She is here from [facility] Randol she needs an IDIOPATHIC INTRACRANIAL HYPERTENSION workup. Getting [facility] notes. Moreso pressure. Especially in the back.  Reviewed notes, labs and imaging from outside physicians, which showed:   Reviewed imaging on CD: MRI brain w/wo, orbits w/wo and MRV all unremarkable.    Per [facility] notes: For evaluation of optic nerves swelling OS complaining of moderately blurred double vision and pain, she has a history of strabismus but after surgery in 2000 and has not been as pronounced, complained of foot years, diplopia OU, ringing in ears, constant headache.  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   REVIEW OF SYSTEMS: Out of a complete 14 system review of symptoms, the patient complains only of the following symptoms, ringing in right ear, floaters and all other reviewed systems are negative.   ALLERGIES: No Known Allergies   HOME MEDICATIONS: Outpatient Medications Prior to Visit  Medication Sig Dispense Refill   acetaZOLAMIDE  (DIAMOX ) 250 MG tablet Take 1 tablet (250 mg total) by mouth 2 (two) times daily. 180 tablet 1   Semaglutide -Weight Management (WEGOVY ) 0.5 MG/0.5ML SOAJ Inject 0.5 mg into the skin once a week. (Patient not taking: Reported on 06/20/2023) 2 mL 6   No facility-administered medications prior to visit.     PAST MEDICAL HISTORY: Past Medical History:  Diagnosis Date   Headache    Idiopathic intracranial hypertension      PAST SURGICAL HISTORY: Past Surgical History:  Procedure Laterality Date   BREAST REDUCTION SURGERY Bilateral 09/29/2022   Procedure: MAMMARY REDUCTION  (BREAST);  Surgeon: Arelia Filippo, MD;  Location: [facility];  Service: Plastics;  Laterality: Bilateral;   EYE SURGERY Right    28yrs old   LUMBAR PUNCTURE     2024     FAMILY HISTORY: Family History  Problem Relation Age of Onset   Hypertension Mother    Migraines Neg Hx      SOCIAL HISTORY: Social History   Socioeconomic History   Marital status: Single    Spouse name: Not on file   Number of children: Not on file   Years of education: Not on file   Highest education level: Not on file  Occupational History   Not on file  Tobacco Use   Smoking status: Never    Passive exposure: Never   Smokeless tobacco: Never  Vaping Use   Vaping status: Never Used  Substance and Sexual Activity   Alcohol use: Not Currently   Drug use: Not Currently   Sexual activity: Yes    Birth control/protection: None  Other Topics Concern   Not on file  Social History Narrative   Caffiene no. Soda sprite-occasional   Working: remote:  call center    Social Drivers of Corporate Investment Banker Strain: Low Risk  (05/21/2023)   Received from [employer]   Overall Financial Resource Strain (CARDIA)    Difficulty of Paying Living Expenses: Not hard at all  Food Insecurity: No Food Insecurity (05/21/2023)   Received from [facility]   Hunger Vital Sign    Worried About Running Out of Food in the Last Year: Never true    Ran Out of Food in the Last Year: Never true  Transportation Needs: No Transportation Needs (05/21/2023)   Received from [facility] - Transportation    Lack of Transportation (Medical): No    Lack of Transportation (Non-Medical): No  Physical Activity: Insufficiently Active (05/21/2023)   Received from [facility]   Exercise Vital Sign    Days  of Exercise per Week: 2 days    Minutes of Exercise per Session: 60 min  Stress: No Stress Concern Present (05/21/2023)   Received from [facility] - Occupational Stress Questionnaire    Feeling of Stress : Not at all  Social Connections: Socially Integrated (05/21/2023)   Received from [facility]   Social Network    How would you rate your social network (family, work, friends)?: Good participation with social networks  Intimate Partner Violence: Not At Risk (05/21/2023)   Received from [facility]   HITS    Over the last 12 months how often did your partner physically hurt you?: Never    Over the last 12 months how often did your partner insult you or talk down to you?: Never    Over the last 12 months how often did your partner threaten you with physical harm?: Never    Over the last 12 months how often did your partner scream or curse at you?: Never     PHYSICAL EXAM  Vitals:   06/20/23 1440  BP: 120/81  Pulse: 78  Weight: 182 lb 8 oz (82.8 kg)  Height: 5' 4 (1.626 m)    Body mass index is 31.33 kg/m.  Generalized: Well developed, in no acute distress  Cardiology: normal rate and rhythm, no  murmur auscultated  Respiratory: clear to auscultation bilaterally    Neurological examination  Mentation: Alert oriented to time, place, history taking. Follows all commands speech and language fluent Cranial nerve II-XII: Pupils were equal round reactive to light. Extraocular movements were full, visual field were full on confrontational test. Facial sensation and strength were normal. Head turning and shoulder shrug  were normal and symmetric. Motor: The motor testing reveals 5 over 5 strength of all 4 extremities. Good symmetric motor tone is noted throughout.  Gait and station: Gait is normal.    DIAGNOSTIC DATA (LABS, IMAGING, TESTING) - I reviewed patient records, labs, notes, testing and imaging myself where available.  Lab Results  Component Value Date   WBC 7.6 07/24/2022   HGB 11.9 07/24/2022   HCT 37.1 07/24/2022   MCV 81 07/24/2022   PLT 355 07/24/2022      Component Value Date/Time   NA 138 09/26/2022 1301   NA 141 07/24/2022 1229   K 3.6 09/26/2022 1301   CL 105 09/26/2022 1301   CO2 24 09/26/2022 1301   GLUCOSE 87 09/26/2022 1301   BUN 8 09/26/2022 1301   BUN 11 07/24/2022 1229   CREATININE 0.70 09/26/2022 1301   CALCIUM 8.9 09/26/2022 1301   PROT 7.5 07/24/2022 1229   ALBUMIN 4.7 07/24/2022 1229   AST 11 07/24/2022 1229   ALT 13 07/24/2022 1229   ALKPHOS 67 07/24/2022 1229   BILITOT 0.3 07/24/2022 1229   GFRNONAA >60 09/26/2022 1301   No results found for: CHOL, HDL, LDLCALC, LDLDIRECT, TRIG, CHOLHDL Lab Results  Component Value Date   HGBA1C 5.7 (H) 07/24/2022   No results found for: VITAMINB12 Lab Results  Component Value Date   TSH 1.690 07/24/2022        No data to display               No data to display           ASSESSMENT AND PLAN  28 y.o. year old female  has a past medical history of Headache and Idiopathic intracranial hypertension. here with    IIH (idiopathic intracranial  hypertension)  Tynika  Charbonneau is doing well. She was previously tolerating acetazolamide  with exception of tingling of ext. We will restart 250mg  BID for now but will anticipate increasing dose to 500mg  BID in the future, pending next eye exam. Side effects reviewed. She was encouraged to focus on weight management.  Continued healthy lifestyle habits encouraged. She will follow up with PCP as directed. She will return to see me in 6 months, sooner if needed. She verbalizes understanding and agreement with this plan.   No orders of the defined types were placed in this encounter.    Meds ordered this encounter  Medications   acetaZOLAMIDE  (DIAMOX ) 250 MG tablet    Sig: Take 1 tablet (250 mg total) by mouth 2 (two) times daily.    Dispense:  180 tablet    Refill:  1    Supervising Provider:   INES ONETHA NOVAK [[id number]]     Greig Forbes, MSN, FNP-C 06/20/2023, 3:11 PM  [facility] [address] [place], [place] [address] [phone]

## 2023-06-19 NOTE — Telephone Encounter (Signed)
 Last saw Christine Lomax,NP 10/30/22. Dx: IIH. Med: Diamox  250mg  po BID. Next appt: 10/29/23 with Dr. Ines 04/30/23 appt was cx by Dr.Ahern d/t being sick   Called pt. Scheduled work in visit with AL.NP 06/20/23 at 2:30pm. Asked she check in by 2:00pm, bring updated insurance cards and med list. She verbalized understanding.   Her last eye appt was with Dr. Octavia over the summer. She wants to get established with new eye doctor. She expressed dissatisfaction with Dr. Dr. Octavia. I recommended she call insurance to get list of in network providers. She will then schedule with someone new.

## 2023-06-19 NOTE — Telephone Encounter (Signed)
Pt calling ringing in ear for 2 or 3 weeks. Also would like check for fluid for a spinal tap. Have scheduled  an appointment with Dr.  Lucia Gaskins on 10/29/23 at 8:30 am and have been put on the waitlist. Would like a call back.

## 2023-06-20 ENCOUNTER — Encounter: Payer: Self-pay | Admitting: Family Medicine

## 2023-06-20 ENCOUNTER — Ambulatory Visit (INDEPENDENT_AMBULATORY_CARE_PROVIDER_SITE_OTHER): Payer: 59 | Admitting: Family Medicine

## 2023-06-20 VITALS — BP 120/81 | HR 78 | Ht 64.0 in | Wt 182.5 lb

## 2023-06-20 DIAGNOSIS — G932 Benign intracranial hypertension: Secondary | ICD-10-CM

## 2023-06-20 MED ORDER — ACETAZOLAMIDE 250 MG PO TABS: 250.0000 mg | ORAL_TABLET | Freq: Two times a day (BID) | ORAL | 1 refills | Status: DC

## 2023-06-20 NOTE — Patient Instructions (Signed)
 Below is our plan:  We will restart acetazolamide  250mg  twice daily. Start 250mg  daily at bedtime for 1 week then increase to 250mg  twice daily. Schedule follow up with Dr Milford office. Ask to see one of the sons.   Please make sure you are staying well hydrated. I recommend 50-60 ounces daily. Well balanced diet and regular exercise encouraged. Consistent sleep schedule with 6-8 hours recommended.   Please continue follow up with care team as directed.   Follow up with me in 6 months   You may receive a survey regarding today's visit. I encourage you to leave honest feed back as I do use this information to improve patient care. Thank you for seeing me today!

## 2023-07-11 ENCOUNTER — Other Ambulatory Visit: Payer: Self-pay | Admitting: *Deleted

## 2023-07-11 DIAGNOSIS — G932 Benign intracranial hypertension: Secondary | ICD-10-CM

## 2023-07-11 MED ORDER — ACETAZOLAMIDE 250 MG PO TABS
250.0000 mg | ORAL_TABLET | Freq: Two times a day (BID) | ORAL | 1 refills | Status: AC
Start: 2023-07-11 — End: ?

## 2023-08-01 ENCOUNTER — Emergency Department (HOSPITAL_BASED_OUTPATIENT_CLINIC_OR_DEPARTMENT_OTHER)
Admission: EM | Admit: 2023-08-01 | Discharge: 2023-08-01 | Disposition: A | Discharge status: Home / Self Care | Payer: Self-pay | Attending: Emergency Medicine | Admitting: Emergency Medicine

## 2023-08-01 ENCOUNTER — Other Ambulatory Visit: Payer: Self-pay

## 2023-08-01 DIAGNOSIS — R059 Cough, unspecified: Secondary | ICD-10-CM | POA: Insufficient documentation

## 2023-08-01 DIAGNOSIS — Z794 Long term (current) use of insulin: Secondary | ICD-10-CM | POA: Insufficient documentation

## 2023-08-01 DIAGNOSIS — R0981 Nasal congestion: Secondary | ICD-10-CM | POA: Insufficient documentation

## 2023-08-01 DIAGNOSIS — J111 Influenza due to unidentified influenza virus with other respiratory manifestations: Secondary | ICD-10-CM

## 2023-08-01 DIAGNOSIS — J029 Acute pharyngitis, unspecified: Secondary | ICD-10-CM | POA: Insufficient documentation

## 2023-08-01 DIAGNOSIS — M545 Low back pain, unspecified: Secondary | ICD-10-CM | POA: Insufficient documentation

## 2023-08-01 LAB — URINALYSIS, ROUTINE W REFLEX MICROSCOPIC
Bilirubin Urine: NEGATIVE
Glucose, UA: NEGATIVE mg/dL
Hgb urine dipstick: NEGATIVE
Ketones, ur: NEGATIVE mg/dL
Leukocytes,Ua: NEGATIVE
Nitrite: NEGATIVE
Protein, ur: NEGATIVE mg/dL
Specific Gravity, Urine: 1.009 (ref 1.005–1.030)
pH: 7 (ref 5.0–8.0)

## 2023-08-01 LAB — GROUP A STREP BY PCR: Group A Strep by PCR: NOT DETECTED

## 2023-08-01 LAB — RESP PANEL BY RT-PCR (RSV, FLU A&B, COVID)  RVPGX2
Influenza A by PCR: NEGATIVE
Influenza B by PCR: NEGATIVE
Resp Syncytial Virus by PCR: NEGATIVE
SARS Coronavirus 2 by RT PCR: NEGATIVE

## 2023-08-01 NOTE — ED Triage Notes (Signed)
 Lower middle back pain. Sore throat. Fatigue. -N/-V/-D.

## 2023-08-01 NOTE — ED Provider Notes (Signed)
 [place] EMERGENCY DEPARTMENT AT [facility] Provider Note   CSN: [id number] Arrival date & time: 08/01/23  1728     History  No chief complaint on file.   Christine Snyder is a 28 y.o. female otherwise healthy presents with complaints of lower back pain, sore throat, cough and congestion for the past few days.  Denies significant abdominal pain, vomiting or diarrhea.  Cough is nonproductive.  Denies any radicular symptoms.  No urinary incontinence.  No IV drug use or unintentional weight loss.  There is no injury or trauma.  No one is sick at home.  Denies any urinary symptoms.  No vaginal bleeding or discharge.  HPI    Past Medical History:  Diagnosis Date   Headache    Idiopathic intracranial hypertension      Home Medications Prior to Admission medications   Medication Sig Start Date End Date Taking? Authorizing Provider  acetaZOLAMIDE (DIAMOX) 250 MG tablet Take 1 tablet (250 mg total) by mouth 2 (two) times daily. 07/11/23   Lomax, Amy, NP  Semaglutide-Weight Management (WEGOVY) 0.5 MG/0.5ML SOAJ Inject 0.5 mg into the skin once a week. Patient not taking: Reported on 06/20/2023 07/24/22   Anson Fret, MD      Allergies    Patient has no known allergies.    Review of Systems   Review of Systems  Musculoskeletal:  Positive for myalgias.    Physical Exam Updated Vital Signs BP 109/61 (BP Location: Right Arm)   Pulse 73   Temp 98.7 F (37.1 C) (Oral)   Resp 20   SpO2 100%  Physical Exam Vitals and nursing note reviewed.  Constitutional:      General: She is not in acute distress.    Appearance: She is well-developed.  HENT:     Head: Normocephalic and atraumatic.     Mouth/Throat:     Pharynx: No oropharyngeal exudate or posterior oropharyngeal erythema.  Eyes:     Conjunctiva/sclera: Conjunctivae normal.  Cardiovascular:     Rate and Rhythm: Normal rate and regular rhythm.     Heart sounds: No murmur heard. Pulmonary:     Effort: Pulmonary  effort is normal. No respiratory distress.     Breath sounds: Normal breath sounds. No wheezing or rales.  Abdominal:     Palpations: Abdomen is soft.     Tenderness: There is abdominal tenderness. There is rebound. There is no guarding.  Musculoskeletal:        General: No swelling.     Cervical back: Neck supple.     Comments: Mild tenderness to SI joints bilateral, no overlying erythema, warmth or fluctuance  Skin:    General: Skin is warm and dry.     Capillary Refill: Capillary refill takes less than 2 seconds.  Neurological:     Mental Status: She is alert.     Comments: Patient is alert and oriented. There is no abnormal phonation. Symmetric smile without facial droop. Moves all extremities spontaneously. 5/5 strength in upper and lower extremities. . No sensation deficit. There is no nystagmus. EOMI, PERRL. Coordination intact with finger to nose and normal ambulation.    Psychiatric:        Mood and Affect: Mood normal.     ED Results / Procedures / Treatments   Labs (all labs ordered are listed, but only abnormal results are displayed) Labs Reviewed  URINALYSIS, ROUTINE W REFLEX MICROSCOPIC - Abnormal; Notable for the following components:      Result Value  Color, Urine COLORLESS (*)    All other components within normal limits  RESP PANEL BY RT-PCR (RSV, FLU A&B, COVID)  RVPGX2  GROUP A STREP BY PCR    EKG None  Radiology No results found.  Procedures Procedures    Medications Ordered in ED Medications - No data to display  ED Course/ Medical Decision Making/ A&P                                 Medical Decision Making Amount and/or Complexity of Data Reviewed Labs: ordered.   This patient presents to the ED with chief complaint(s) of flulike symptoms and low back pain..  The complaint involves an extensive differential diagnosis and also carries with it a high risk of complications and morbidity.   pertinent past medical history as listed in  HPI  The differential diagnosis includes  viral illness, pharyngitis, mono, sinusitis,AOM, pneumonia, cauda equina, spinal abscess, back strain The initial plan is to  Obtain respiratory panel Additional history obtained: Records reviewed Care Everywhere/External Records  Initial Assessment:   Nontoxic-appearing patient presenting with URI symptoms and low back pain.  I have a low suspicion for pneumonia as lung sounds are clear and patient is afebrile.  Do not feel that any imaging is indicated today.  No exudate or significant cervical lymphadenopathy on exam to suggest strep or mono.  No neurodeficits on exam.  She she does have mild tenderness to bilateral SI joint region.  No radicular symptoms or red flag findings.  She is able to ambulate without difficulty.  Overall history and exam are most suspicious for viral URI +/- back strain.  Patient is hemodynamically stable and not requiring oxygen.  Anticipate discharge home with supportive care.  Independent ECG interpretation:  None   Independent labs interpretation:  The following labs were independently interpreted:  UA without significant findings, respiratory panel and strep test negative  Independent visualization and interpretation of imaging: none  Treatment and Reassessment: No medications administered during visit   Consultations obtained:   None   Disposition:   Patient will be discharged home.  Educated on supportive care.  Encouraged to follow-up with primary care provider should her symptoms persist. The patient has been appropriately medically screened and/or stabilized in the ED. I have low suspicion for any other emergent medical condition which would require further screening, evaluation or treatment in the ED or require inpatient management. At time of discharge the patient is hemodynamically stable and in no acute distress. I have discussed work-up results and diagnosis with patient and answered all questions.  Patient is agreeable with discharge plan. We discussed strict return precautions for returning to the emergency department and they verbalized understanding.     Social Determinants of Health:   none  This note was dictated with voice recognition software.  Despite best efforts at proofreading, errors may have occurred which can change the documentation meaning.          Final Clinical Impression(s) / ED Diagnoses Final diagnoses:  Acute bilateral low back pain without sciatica  Influenza-like illness    Rx / DC Orders ED Discharge Orders     None         Fabienne Bruns 08/01/23 2236    Lorre Nick, MD 08/03/23 1120

## 2023-08-01 NOTE — ED Notes (Signed)
 Discharge instructions reviewed.   Opportunity for questions and concerns provided.   Alert, oriented and ambulatory. Displays no signs of distress.

## 2023-08-01 NOTE — Discharge Instructions (Signed)
 You were evaluated in the emergency room for low back pain, sore throat and nonproductive cough.You may use Tylenol 1000 mg and/or Motrin 600 mg every 4-6 hours up to 3 times a day for fever or body aches.  Please keep in mind that this dosing is not meant to be continued long-term and that many over-the-counter cough and flu medications contain acetaminophen or ibuprofen. You can expect your current symptoms to linger over the next week or two but please return to the emergency room if you experience any new or worsening symptoms including persistent fevers, worsening productive cough and persistent vomiting. Please follow-up with your primary care provider regarding your ER visit.

## 2023-08-23 ENCOUNTER — Encounter: Payer: Self-pay | Admitting: Neurology

## 2023-08-23 MED ORDER — WEGOVY 0.5 MG/0.5ML ~~LOC~~ SOAJ: 0.5000 mg | SUBCUTANEOUS | 6 refills | Status: DC

## 2023-10-29 ENCOUNTER — Ambulatory Visit: Payer: Self-pay | Admitting: Neurology

## 2023-12-10 IMAGING — DX DG CHEST 2V
2 series · 2 of 2 positions shown · non-contrast
Comparison: 11/10/2020

CLINICAL DATA: Cough, fever and chills beginning yesterday. Chest
pain.

EXAM:
CHEST - 2 VIEW

[chest pa]
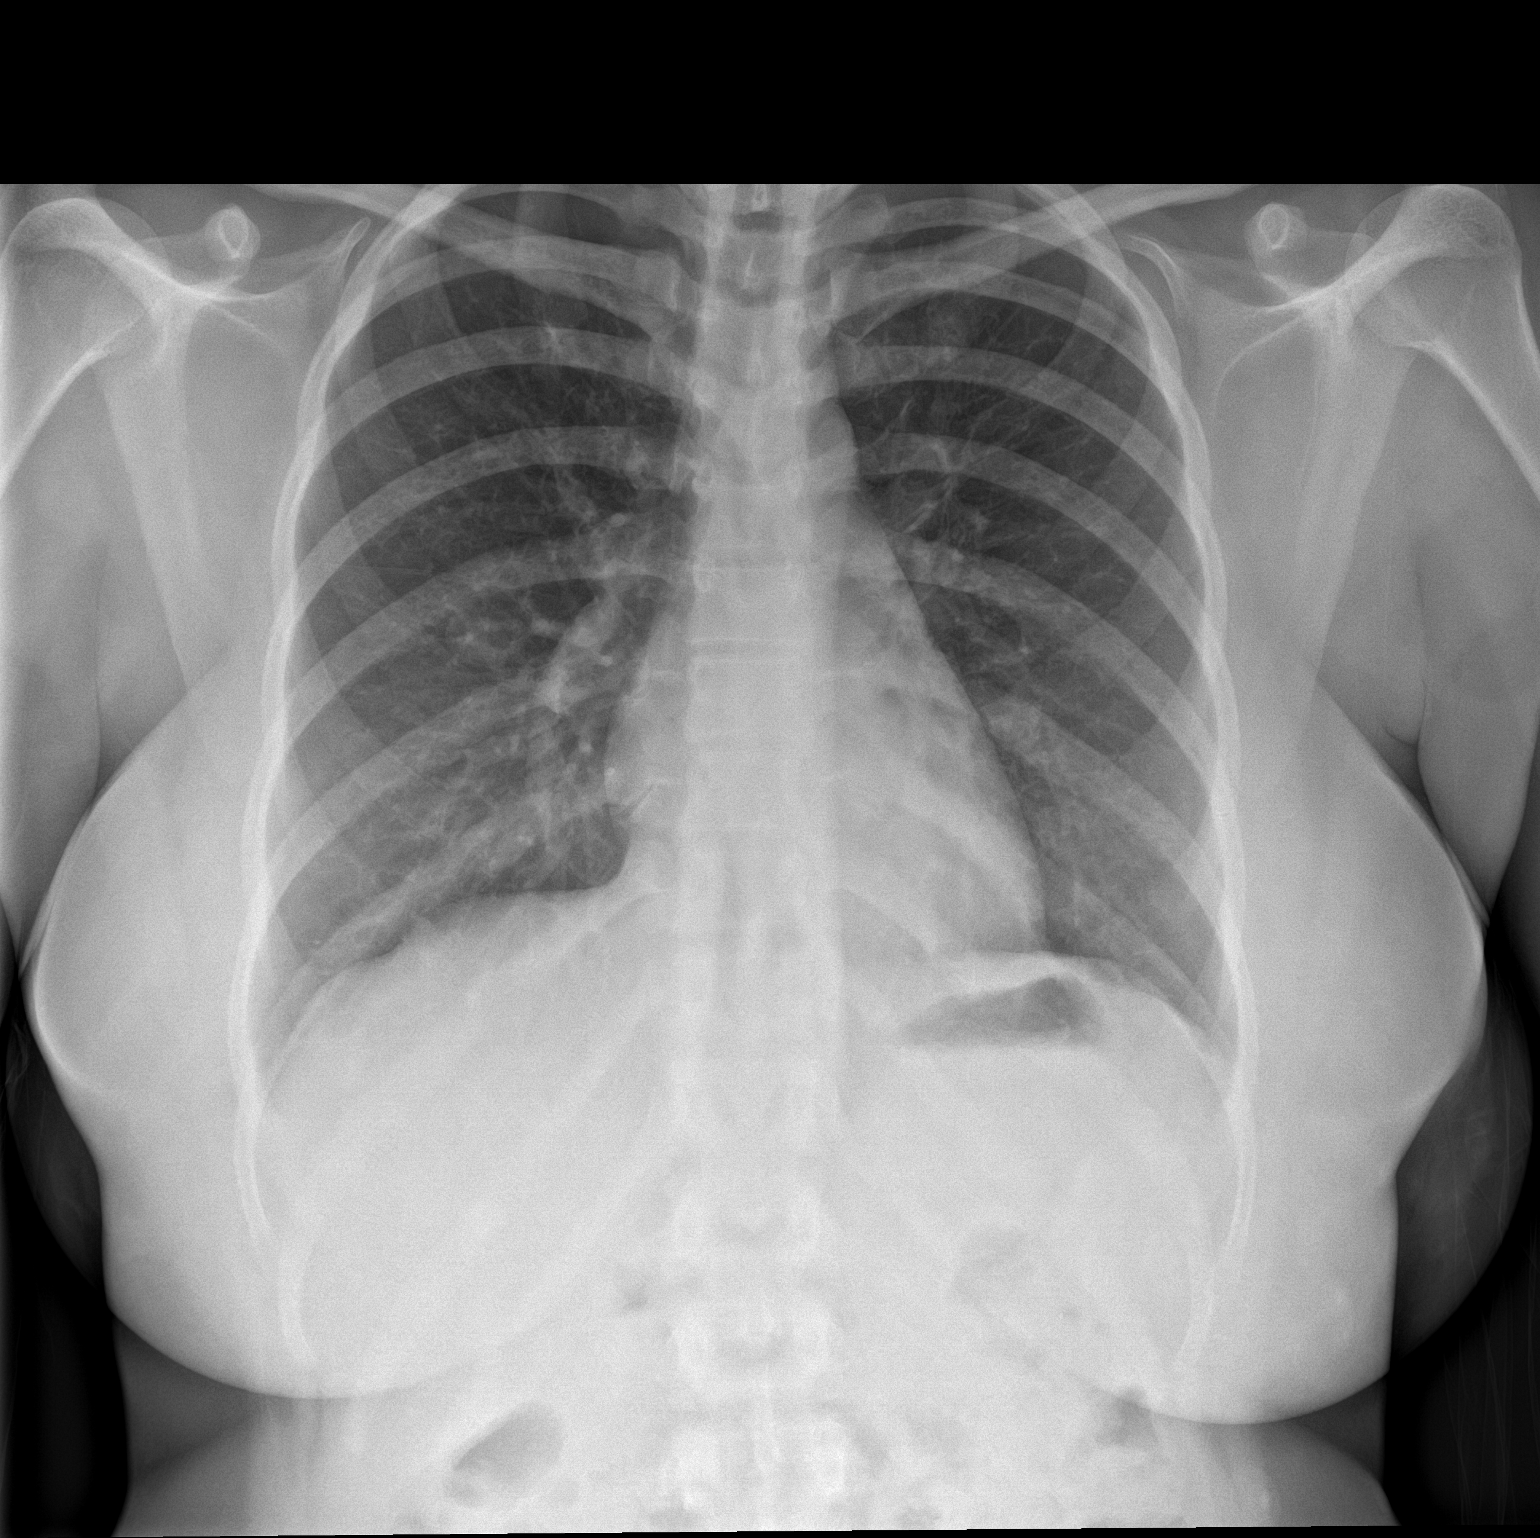

[chest lat]
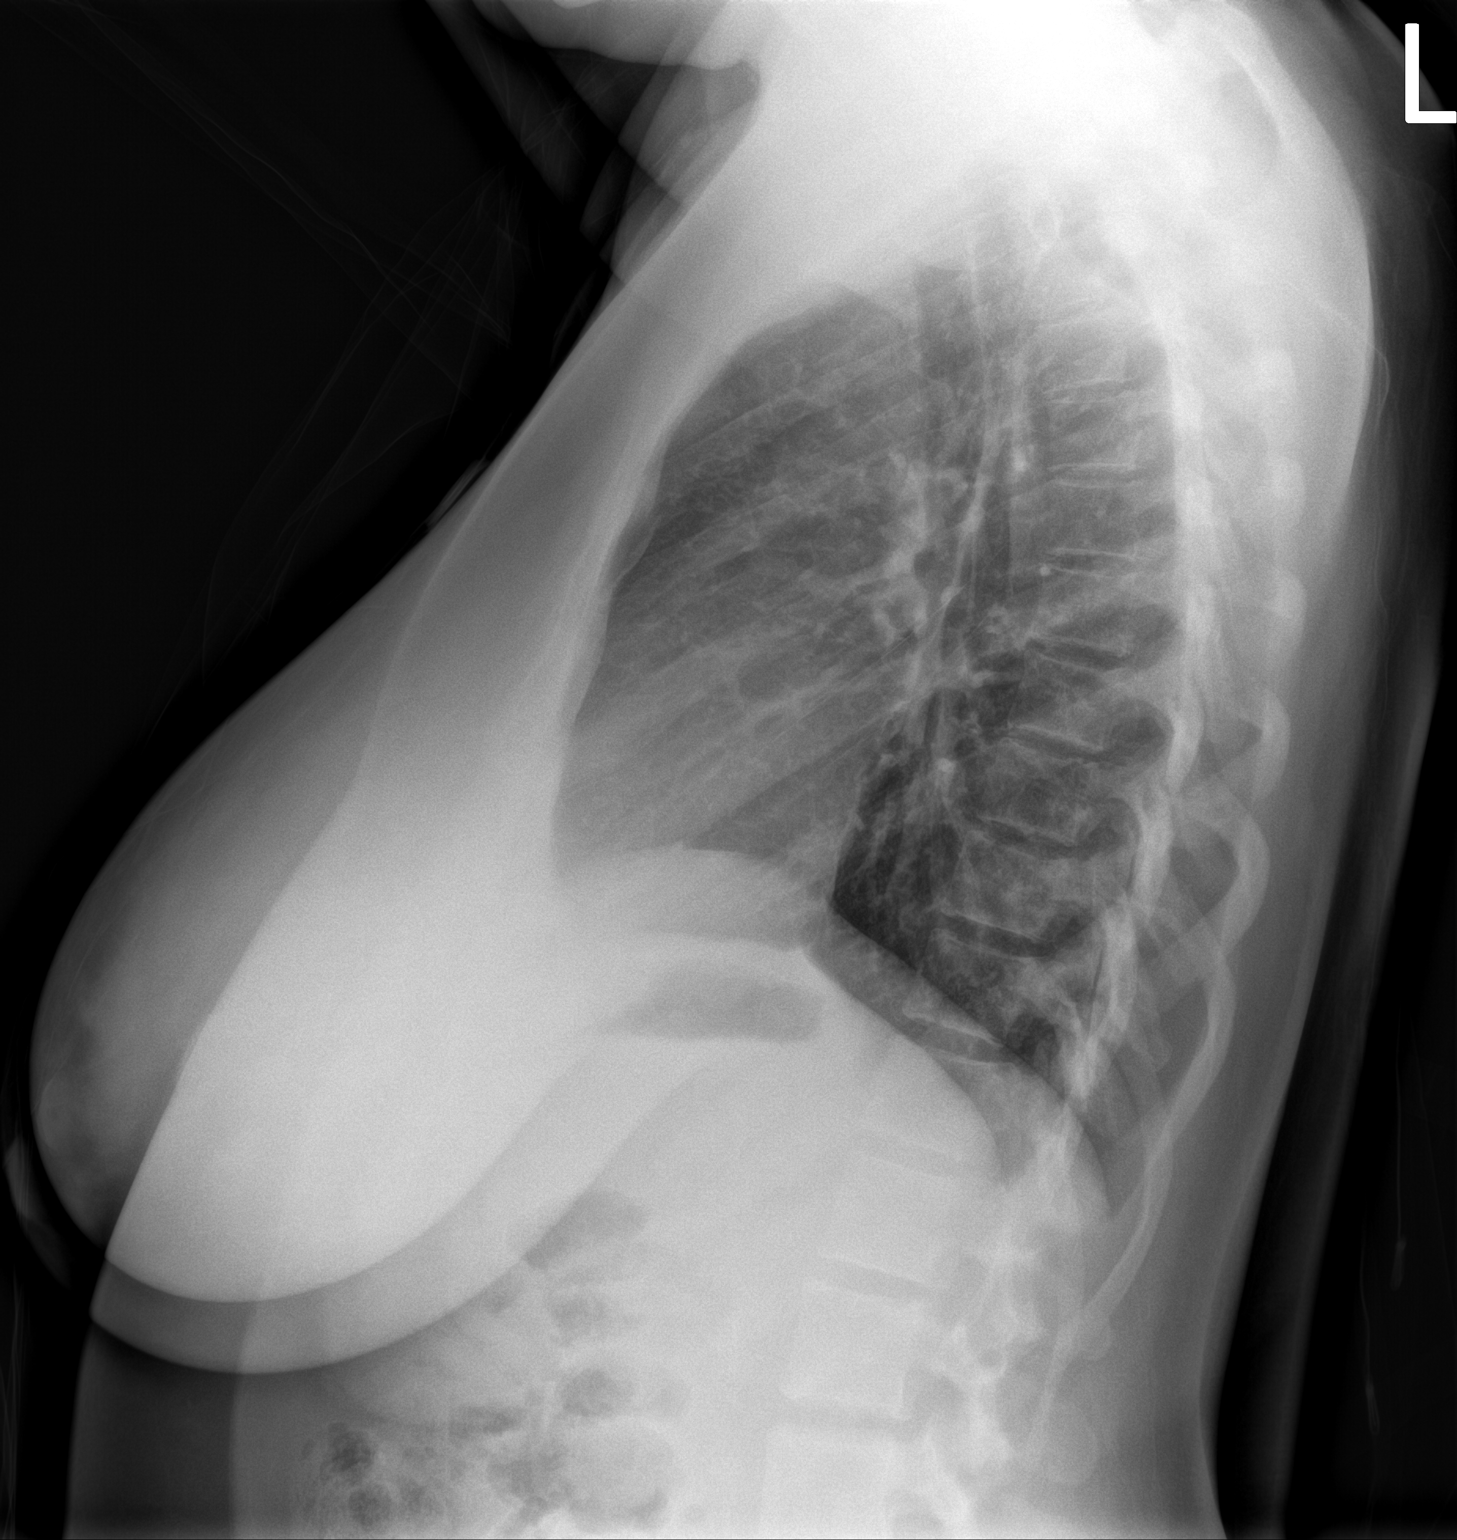

[2 of 2 positions shown; findings below may reference images not displayed]

FINDINGS: The heart size and mediastinal contours are within normal limits.
Both lungs are clear. The visualized skeletal structures are
unremarkable.
IMPRESSION: No active cardiopulmonary disease.

## 2023-12-11 ENCOUNTER — Telehealth: Admitting: Physician Assistant

## 2023-12-11 DIAGNOSIS — M25512 Pain in left shoulder: Secondary | ICD-10-CM

## 2023-12-11 DIAGNOSIS — R0789 Other chest pain: Secondary | ICD-10-CM | POA: Diagnosis not present

## 2023-12-11 MED ORDER — CYCLOBENZAPRINE HCL 10 MG PO TABS
10.0000 mg | ORAL_TABLET | Freq: Three times a day (TID) | ORAL | 0 refills | Status: AC | PRN
Start: 2023-12-11 — End: ?

## 2023-12-11 MED ORDER — PREDNISONE 10 MG (21) PO TBPK
ORAL_TABLET | ORAL | 0 refills | Status: DC
Start: 2023-12-11 — End: 2024-01-03

## 2023-12-11 NOTE — Progress Notes (Signed)
 Virtual Visit Consent   Christine Snyder, you are scheduled for a virtual visit with a [facility] provider today. Just as with appointments in the office, your consent must be obtained to participate. Your consent will be active for this visit and any virtual visit you may have with one of our providers in the next 365 days. If you have a MyChart account, a copy of this consent can be sent to you electronically.  As this is a virtual visit, video technology does not allow for your provider to perform a traditional examination. This may limit your provider's ability to fully assess your condition. If your provider identifies any concerns that need to be evaluated in person or the need to arrange testing (such as labs, EKG, etc.), we will make arrangements to do so. Although advances in technology are sophisticated, we cannot ensure that it will always work on either your end or our end. If the connection with a video visit is poor, the visit may have to be switched to a telephone visit. With either a video or telephone visit, we are not always able to ensure that we have a secure connection.  By engaging in this virtual visit, you consent to the provision of healthcare and authorize for your insurance to be billed (if applicable) for the services provided during this visit. Depending on your insurance coverage, you may receive a charge related to this service.  I need to obtain your verbal consent now. Are you willing to proceed with your visit today? Christine Snyder has provided verbal consent on 12/11/2023 for a virtual visit (video or telephone). Christine Snyder, [place]  Date: 12/11/2023 5:06 PM   Virtual Visit via Video Note   I, Christine Snyder, connected with  Christine Snyder  ([id number], Jul 16, 1995) on 12/11/23 at  4:30 PM EDT by a video-enabled telemedicine application and verified that I am speaking with the correct person using two identifiers.  Location: Patient: Virtual Visit Location  Patient: Home Provider: Virtual Visit Location Provider: Home Office   I discussed the limitations of evaluation and management by telemedicine and the availability of in person appointments. The patient expressed understanding and agreed to proceed.    History of Present Illness: Christine Snyder is a 28 y.o. who identifies as a female who was assigned female at birth, and is being seen today for pain in R shoulder after a fall at home 4 days ago. Endorses she was sitting in chair and leaned over to grab something. The chair broke and she fell, landing on her R shoulder. Denies head injury/LOC. Was helped up to standing position. Initial mild soreness but was able to go about normal activity. No noted bruising. Since then with soreness of her R lateral shoulder with soreness across the anterior chest.  Mild swelling of back of shoulder. Has normal ROM despite pain. Increased pain with trying to get her shirt on. Does note occasional tingling in the arm/hand.   OTC -- BC powder x 2 -- only slight help, ice pack/heat pack.   Denies concern for pregnancy.   HPI: HPI  Problems:  Patient Active Problem List   Diagnosis Date Noted   IIH (idiopathic intracranial hypertension) 05/29/2022   Papilledema 05/29/2022   Weight gain 05/29/2022    Allergies:  Allergies  Allergen Reactions   Mushroom Itching   Medications:  Current Outpatient Medications:    cyclobenzaprine  (FLEXERIL ) 10 MG tablet, Take 1 tablet (10 mg total) by mouth 3 (three) times daily as  needed., Disp: 15 tablet, Rfl: 0   predniSONE  (STERAPRED UNI-PAK 21 TAB) 10 MG (21) TBPK tablet, Take following package directions, Disp: 21 tablet, Rfl: 0   acetaZOLAMIDE  (DIAMOX ) 250 MG tablet, Take 1 tablet (250 mg total) by mouth 2 (two) times daily., Disp: 180 tablet, Rfl: 1  Observations/Objective: Patient is well-developed, well-nourished in no acute distress.  Resting comfortably  at home.  Head is normocephalic, atraumatic.  No  labored breathing.  Speech is clear and coherent with logical content.  Patient is alert and oriented at baseline.  Normal ROM of R shoulder demonstrated.   Assessment and Plan: 1. Chest wall pain (Primary) - cyclobenzaprine  (FLEXERIL ) 10 MG tablet; Take 1 tablet (10 mg total) by mouth 3 (three) times daily as needed.  Dispense: 15 tablet; Refill: 0 - predniSONE  (STERAPRED UNI-PAK 21 TAB) 10 MG (21) TBPK tablet; Take following package directions  Dispense: 21 tablet; Refill: 0  2. Acute pain of left shoulder - cyclobenzaprine  (FLEXERIL ) 10 MG tablet; Take 1 tablet (10 mg total) by mouth 3 (three) times daily as needed.  Dispense: 15 tablet; Refill: 0 - predniSONE  (STERAPRED UNI-PAK 21 TAB) 10 MG (21) TBPK tablet; Take following package directions  Dispense: 21 tablet; Refill: 0  After fall from chair at home. Normal ROM. Suspected mild contusion and strain. Supportive measures and OTC medications reviewed. Sterapred and Flexeril  per orders. Work note provided. In person follow-up precautions reviewed.   Follow Up Instructions: I discussed the assessment and treatment plan with the patient. The patient was provided an opportunity to ask questions and all were answered. The patient agreed with the plan and demonstrated an understanding of the instructions.  A copy of instructions were sent to the patient via MyChart unless otherwise noted below.   The patient was advised to call back or seek an in-person evaluation if the symptoms worsen or if the condition fails to improve as anticipated.    Christine Velma Lunger, PA-C

## 2023-12-11 NOTE — Patient Instructions (Signed)
  Christine Snyder, thank you for joining Christine Velma Lunger, PA-C for today's virtual visit.  While this provider is not your primary care provider (PCP), if your PCP is located in our provider database this encounter information will be shared with them immediately following your visit.   A [facility] MyChart account gives you access to today's visit and all your visits, tests, and labs performed at [facility]  click here if you don't have a [name] account or go to [email or web address]  Consent: (Patient) Christine Snyder provided verbal consent for this virtual visit at the beginning of the encounter.  Current Medications:  Current Outpatient Medications:    cyclobenzaprine  (FLEXERIL ) 10 MG tablet, Take 1 tablet (10 mg total) by mouth 3 (three) times daily as needed., Disp: 15 tablet, Rfl: 0   predniSONE  (STERAPRED UNI-PAK 21 TAB) 10 MG (21) TBPK tablet, Take following package directions, Disp: 21 tablet, Rfl: 0   acetaZOLAMIDE  (DIAMOX ) 250 MG tablet, Take 1 tablet (250 mg total) by mouth 2 (two) times daily., Disp: 180 tablet, Rfl: 1   Medications ordered in this encounter:  Meds ordered this encounter  Medications   cyclobenzaprine  (FLEXERIL ) 10 MG tablet    Sig: Take 1 tablet (10 mg total) by mouth 3 (three) times daily as needed.    Dispense:  15 tablet    Refill:  0    Supervising Provider:   BLAISE ALEENE KIDD [[id number]]   predniSONE  (STERAPRED UNI-PAK 21 TAB) 10 MG (21) TBPK tablet    Sig: Take following package directions    Dispense:  21 tablet    Refill:  0    Supervising Provider:   BLAISE ALEENE KIDD [[id number]]     *If you need refills on other medications prior to your next appointment, please contact your pharmacy*  Follow-Up: Call back or seek an in-person evaluation if the symptoms worsen or if the condition fails to improve as anticipated.  [facility] [phone]  Other Instructions Avoid heavy lifting and  overexertion.  Alternate ice/heat to the neck/shoulder/chest. Take prescribed medications as directed, remembering to not take the muscle relaxant if you need to drive or operate machinery.  Tylenol  OTC as discussed.  If you note any non-resolving, new, or worsening symptoms despite treatment, please seek an in-person evaluation ASAP.    If you have been instructed to have an in-person evaluation today at a local Urgent Care facility, please use the link below. It will take you to a list of all of our available [facility], including address, phone number and hours of operation. Please do not delay care.  [facility]  If you or a family member do not have a primary care provider, use the link below to schedule a visit and establish care. When you choose a [facility] primary care physician or advanced practice provider, you gain a long-term partner in health. Find a Primary Care Provider  Learn more about [facility]'s in-office and virtual care options: [facility] - Get Care Now

## 2024-01-02 NOTE — Progress Notes (Unsigned)
 No chief complaint on file.   HISTORY OF PRESENT ILLNESS:  01/02/24 ALL:  Baker returns for follow up for IIH. She was last seen by me 06/2023 and we restarted acetazolamide  250mg  BID. Wegovy  called in 08/2023 to help with weight management but.... Since,   06/20/2023 ALL: Cricket returns for follow up for IIH. She was last seen 10/2022 and doing well on acetazolamide  250mg  BID. We planned to increase dose to 500mg  BID following eye exam scheduled 12/2022. Since, she reports doing well until recently. She stopped acetazolamide  about 3-4 months ago as she was feeling well. She reports that over the past few weeks, headaches have returned. She is hearing a swooshing sound in both ears. She has floaters in right eye. She has not updated eye exam. She denies vision loss. She has gained about 10lbs.  10/30/2022 ALL:  Torina Ey is a 28 y.o. female here today for follow up for IIH.  She was last seen by Dr Ines 07/2022. LP showed opening pressure 36, closing 14cmH20. She was started on Diamox  125mg  BID. She reports headaches and swooshing sound in right ear had resolved following LP. About a month ago right ear started bothering her again. Dose was increased to 250mg  BID last week. She has not noticed much difference in ear. Floaters come and go. No vision loss. She does note significant tingling in fingertips. She tolerates med, otherwise.   Dr Octavia evaluated prior to LP but not since. She has a follow up scheduled 12/2022. She was approved for Wegovy . She has been hesitant to take it. She has lost about 20lbs by changing her diet. She recently had a breast reduction.    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 She has  significantly improved. She was a 36 and went down a 14. She is feeling great still. Some pulsating in the ears. Will start diamox  125 bid then increase to 250 mg bid if needed.    Patient complains of symptoms per HPI as well as the following symptoms: neck pain . Pertinent negatives and positives per HPI. All others negative     HPI:  Mahlia Fernando is a 28 y.o. female here as requested by No ref. provider found for Headaches. I reviewed EPIC, she has no PMHx in her chart. I reviewed ED notes from 03/18/2022 Dr. Randol she presented with headaches for several months. She saw a neurologist and ophthalmologist with concrns for IDIOPATHIC INTRACRANIAL HYPERTENSION. She had an outpatient MRi never had an LP. Exam was reassuring and a migraine cocktail helped and she was discharged. I do not have her neurology notes. She went to her eye doctor, noticed ONH edema. Ringing in ears, daily headaches, pressure and more in the nack of the head and behind her ears with pressure, [facility], she wears glasses and she thought her blurry vision was just getting older, having floaters, episodes of blurry and bending over would go completely dark. Also hearing issues, fluid behind the ears, like in a seashell, neck pain. She is here from [facility] Randol she needs an IDIOPATHIC INTRACRANIAL HYPERTENSION workup. Getting [facility] notes. Moreso pressure. Especially in the back.    Reviewed notes, labs and imaging from outside  physicians, which showed:   Reviewed imaging on CD: MRI brain w/wo, orbits w/wo and MRV all unremarkable.    Per [facility] notes: For evaluation of optic nerves swelling OS complaining of moderately blurred double vision and pain, she has a history of strabismus but after surgery in 2000 and has not been as pronounced, complained of foot years, diplopia OU, ringing in ears, constant headache.  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   REVIEW OF SYSTEMS: Out of a complete 14 system review of symptoms, the patient complains only of the following symptoms, ringing in right ear, floaters and all other reviewed systems are negative.   ALLERGIES: Allergies  Allergen Reactions   Mushroom Itching     HOME MEDICATIONS: Outpatient Medications Prior to Visit  Medication Sig Dispense Refill   acetaZOLAMIDE  (DIAMOX ) 250 MG tablet Take 1 tablet (250 mg total) by mouth 2 (two) times daily. 180 tablet 1   cyclobenzaprine  (FLEXERIL ) 10 MG tablet Take 1 tablet (10 mg total) by mouth 3 (three) times daily as needed. 15 tablet 0   predniSONE  (STERAPRED UNI-PAK 21 TAB) 10 MG (21) TBPK tablet Take following package directions 21 tablet 0   No facility-administered medications prior to visit.     PAST MEDICAL HISTORY: Past Medical History:  Diagnosis Date   Headache    Idiopathic intracranial hypertension      PAST SURGICAL HISTORY: Past Surgical History:  Procedure Laterality Date   BREAST REDUCTION SURGERY Bilateral 09/29/2022   Procedure: MAMMARY REDUCTION  (BREAST);  Surgeon: Arelia Filippo, MD;  Location: [facility];  Service: Plastics;  Laterality: Bilateral;   EYE SURGERY Right    28yrs old   LUMBAR PUNCTURE     2024     FAMILY HISTORY: Family History  Problem Relation Age of Onset   Hypertension Mother    Migraines Neg Hx      SOCIAL HISTORY: Social History   Socioeconomic History   Marital status: Single    Spouse name: Not on file   Number of children: Not on file   Years of education: Not on file   Highest education level: Not on file  Occupational History   Not on file  Tobacco Use   Smoking status: Never    Passive exposure: Never   Smokeless tobacco: Never  Vaping Use   Vaping status: Never Used  Substance and Sexual Activity   Alcohol use: Not Currently   Drug use: Not Currently   Sexual activity: Yes    Birth control/protection: None  Other Topics Concern    Not on file  Social History Narrative   Caffiene no. Soda sprite-occasional   Working: remote:  call center   Social Drivers of Corporate investment banker Strain: Low Risk  (05/21/2023)   Received from [facility]   Overall Financial Resource Strain (CARDIA)    Difficulty of Paying Living Expenses: Not hard at all  Food Insecurity: No Food Insecurity (05/21/2023)   [facility]   Hunger Vital Sign    Within the past 12 months, you worried that your food would run out before you got the money to buy more.: Never true    Within the past 12 months, the food you bought just didn't last and you didn't have money to get more.: Never true  Transportation Needs: No Transportation Needs (05/21/2023)   [facility]  PRAPARE - Administrator, Civil Service (Medical): No    Lack of Transportation (Non-Medical): No  Physical Activity: Insufficiently Active (05/21/2023)   Received [facility]   Exercise Vital Sign    On average, how many days per week do you engage in moderate to strenuous exercise (like a brisk walk)?: 2 days    On average, how many minutes do you engage in exercise at this level?: 60 min  Stress: No Stress Concern Present (05/21/2023)   Received from [facility] - Occupational Stress Questionnaire    Feeling of Stress : Not at all  Social Connections: Socially Integrated (05/21/2023)   Received from [facility]   Social Network    How would you rate your social network (family, work, friends)?: Good participation with social networks  Intimate Partner Violence: Not At Risk (05/21/2023)   Received from [facility]   HITS    Over the last 12 months how often did your partner physically hurt you?: Never    Over the last 12 months how often did your partner insult you or talk down to you?: Never    Over the last 12 months how often did your partner threaten you with physical harm?: Never     Over the last 12 months how often did your partner scream or curse at you?: Never     PHYSICAL EXAM  There were no vitals filed for this visit.   There is no height or weight on file to calculate BMI.  Generalized: Well developed, in no acute distress  Cardiology: normal rate and rhythm, no murmur auscultated  Respiratory: clear to auscultation bilaterally    Neurological examination  Mentation: Alert oriented to time, place, history taking. Follows all commands speech and language fluent Cranial nerve II-XII: Pupils were equal round reactive to light. Extraocular movements were full, visual field were full on confrontational test. Facial sensation and strength were normal. Head turning and shoulder shrug  were normal and symmetric. Motor: The motor testing reveals 5 over 5 strength of all 4 extremities. Good symmetric motor tone is noted throughout.  Gait and station: Gait is normal.    DIAGNOSTIC DATA (LABS, IMAGING, TESTING) - I reviewed patient records, labs, notes, testing and imaging myself where available.  Lab Results  Component Value Date   WBC 7.6 07/24/2022   HGB 11.9 07/24/2022   HCT 37.1 07/24/2022   MCV 81 07/24/2022   PLT 355 07/24/2022      Component Value Date/Time   NA 138 09/26/2022 1301   NA 141 07/24/2022 1229   K 3.6 09/26/2022 1301   CL 105 09/26/2022 1301   CO2 24 09/26/2022 1301   GLUCOSE 87 09/26/2022 1301   BUN 8 09/26/2022 1301   BUN 11 07/24/2022 1229   CREATININE 0.70 09/26/2022 1301   CALCIUM 8.9 09/26/2022 1301   PROT 7.5 07/24/2022 1229   ALBUMIN 4.7 07/24/2022 1229   AST 11 07/24/2022 1229   ALT 13 07/24/2022 1229   ALKPHOS 67 07/24/2022 1229   BILITOT 0.3 07/24/2022 1229   GFRNONAA >60 09/26/2022 1301   No results found for: CHOL, HDL, LDLCALC, LDLDIRECT, TRIG, CHOLHDL Lab Results  Component Value Date   HGBA1C 5.7 (H) 07/24/2022   No results found for: [id number] Lab Results  Component Value Date   TSH  1.690 07/24/2022        No data to display  No data to display           ASSESSMENT AND PLAN  28 y.o. year old female  has a past medical history of Headache and Idiopathic intracranial hypertension. here with    No diagnosis found.  Yoceline Shepherd is doing well. She was previously tolerating acetazolamide  with exception of tingling of ext. We will restart 250mg  BID for now but will anticipate increasing dose to 500mg  BID in the future, pending next eye exam. Side effects reviewed. She was encouraged to focus on weight management.  Continued healthy lifestyle habits encouraged. She will follow up with PCP as directed. She will return to see me in 6 months, sooner if needed. She verbalizes understanding and agreement with this plan.   No orders of the defined types were placed in this encounter.    No orders of the defined types were placed in this encounter.    Greig Forbes, MSN, FNP-C 01/02/2024, 11:03 AM  [facility] [address] [place], [place] [address] [phone]

## 2024-01-02 NOTE — Patient Instructions (Signed)
 Below is our plan:  We will continue acetazolamide  250mg  twice daily. Please update eye exam to check for any optic disc swelling and potential cause of floaters. May sure to stay well hydrated. I will resent Wegovy  rx. I have placed a referral to [facility]. Listen for a call to schedule visit.   Please make sure you are staying well hydrated. I recommend 50-60 ounces daily. Well balanced diet and regular exercise encouraged. Consistent sleep schedule with 6-8 hours recommended.   Please continue follow up with care team as directed.   Follow up with me in 6 months  You may receive a survey regarding today's visit. I encourage you to leave honest feed back as I do use this information to improve patient care. Thank you for seeing me today!   GENERAL HEADACHE INFORMATION:   Natural supplements: Magnesium Oxide or Magnesium Glycinate 500 mg at bed (up to 800 mg daily) Coenzyme Q10 300 mg in AM Vitamin B2- 200 mg twice a day   Add 1 supplement at a time since even natural supplements can have undesirable side effects. You can sometimes buy supplements cheaper (especially Coenzyme Q10) at www.WebmailGuide.co.za or at [facility].  Migraine with aura: There is increased risk for stroke in women with migraine with aura and a contraindication for the combined contraceptive pill for use by women who have migraine with aura. The risk for women with migraine without aura is lower. However other risk factors like smoking are far more likely to increase stroke risk than migraine. There is a recommendation for no smoking and for the use of OCPs without estrogen such as progestogen only pills particularly for women with migraine with aura.SABRA People who have migraine headaches with auras may be 3 times more likely to have a stroke caused by a blood clot, compared to migraine patients who don't see auras. Women who take hormone-replacement therapy may be 30 percent more likely to suffer a clot-based stroke  than women not taking medication containing estrogen. Other risk factors like smoking and high blood pressure may be  much more important.    Vitamins and herbs that show potential:   Magnesium: Magnesium (250 mg twice a day or 500 mg at bed) has a relaxant effect on smooth muscles such as blood vessels. Individuals suffering from frequent or daily headache usually have low magnesium levels which can be increase with daily supplementation of 400-750 mg. Three trials found 40-90% average headache reduction  when used as a preventative. Magnesium may help with headaches are aura, the best evidence for magnesium is for migraine with aura is its thought to stop the cortical spreading depression we believe is the pathophysiology of migraine aura.Magnesium also demonstrated the benefit in menstrually related migraine.  Magnesium is part of the messenger system in the serotonin cascade and it is a good muscle relaxant.  It is also useful for constipation which can be a side effect of other medications used to treat migraine. Good sources include nuts, whole grains, and tomatoes. Side Effects: loose stool/diarrhea  Riboflavin (vitamin B 2) 200 mg twice a day. This vitamin assists nerve cells in the production of ATP a principal energy storing molecule.  It is necessary for many chemical reactions in the body.  There have been at least 3 clinical trials of riboflavin using 400 mg per day all of which suggested that migraine frequency can be decreased.  All 3 trials showed significant improvement in over half of migraine sufferers.  The supplement  is found in bread, cereal, milk, meat, and poultry.  Most Americans get more riboflavin than the recommended daily allowance, however riboflavin deficiency is not necessary for the supplements to help prevent headache. Side effects: energizing, green urine   Coenzyme Q10: This is present in almost all cells in the body and is critical component for the conversion of energy.   Recent studies have shown that a nutritional supplement of CoQ10 can reduce the frequency of migraine attacks by improving the energy production of cells as with riboflavin.  Doses of 150 mg twice a day have been shown to be effective.   Melatonin: Increasing evidence shows correlation between melatonin secretion and headache conditions.  Melatonin supplementation has decreased headache intensity and duration.  It is widely used as a sleep aid.  Sleep is natures way of dealing with migraine.  A dose of 3 mg is recommended to start for headaches including cluster headache. Higher doses up to 15 mg has been reviewed for use in Cluster headache and have been used. The rationale behind using melatonin for cluster is that many theories regarding the cause of Cluster headache center around the disruption of the normal circadian rhythm in the brain.  This helps restore the normal circadian rhythm.   HEADACHE DIET: Foods and beverages which may trigger migraine Note that only 20% of headache patients are food sensitive. You will know if you are food sensitive if you get a headache consistently 20 minutes to 2 hours after eating a certain food. Only cut out a food if it causes headaches, otherwise you might remove foods you enjoy! What matters most for diet is to eat a well balanced healthy diet full of vegetables and low fat protein, and to not miss meals.   Chocolate, other sweets ALL cheeses except cottage and cream cheese Dairy products, yogurt, sour cream, ice cream Liver Meat extracts (Bovril, Marmite, meat tenderizers) Meats or fish which have undergone aging, fermenting, pickling or smoking. These include: Hotdogs,salami,Lox,sausage, mortadellas,smoked salmon, pepperoni, Pickled herring Pods of broad bean (English beans, Chinese pea pods, [place] (fava) beans, lima and navy beans Ripe avocado, ripe banana Yeast extracts or active yeast preparations such as Brewer's or Fleishman's (commercial bakes  goods are permitted) Tomato based foods, pizza (lasagna, etc.)   MSG (monosodium glutamate) is disguised as many things; look for these common aliases: Monopotassium glutamate Autolysed yeast Hydrolysed protein Sodium caseinate "flavorings" "all natural preservatives Nutrasweet   Avoid all other foods that convincingly provoke headaches.   Resources: The Dizzy Bluford Aid Your Headache Diet, migrainestrong.com  https://zamora-andrews.com/   Caffeine and Migraine For patients that have migraine, caffeine intake more than 3 days per week can lead to dependency and increased migraine frequency. I would recommend cutting back on your caffeine intake as best you can. The recommended amount of caffeine is 200-300 mg daily, although migraine patients may experience dependency at even lower doses. While you may notice an increase in headache temporarily, cutting back will be helpful for headaches in the long run. For more information on caffeine and migraine, visit: https://americanmigrainefoundation.org/resource-library/caffeine-and-migraine/   Headache Prevention Strategies:   1. Maintain a headache diary; learn to identify and avoid triggers.  - This can be a simple note where you log when you had a headache, associated symptoms, and medications used - There are several smartphone apps developed to help track migraines: Migraine Buddy, Migraine Monitor, Curelator N1-Headache App   Common triggers include: Emotional triggers: Emotional/Upset family or friends Emotional/Upset occupation Business reversal/success Anticipation anxiety  Crisis-serious Post-crisis periodNew job/position   Physical triggers: Vacation Day Weekend Strenuous Exercise High Altitude Location New Move Menstrual Day Physical Illness Oversleep/Not enough sleep Weather changes Light: Photophobia or light sesnitivity treatment involves a balance between  desensitization and reduction in overly strong input. Use dark polarized glasses outside, but not inside. Avoid bright or fluorescent light, but do not dim environment to the point that going into a normally lit room hurts. Consider FL-41 tint lenses, which reduce the most irritating wavelengths without blocking too much light.  These can be obtained at axonoptics.com or theraspecs.com Foods: see list above.   2. Limit use of acute treatments (over-the-counter medications, triptans, etc.) to no more than 2 days per week or 10 days per month to prevent medication overuse headache (rebound headache).     3. Follow a regular schedule (including weekends and holidays): Don't skip meals. Eat a balanced diet. 8 hours of sleep nightly. Minimize stress. Exercise 30 minutes per day. Being overweight is associated with a 5 times increased risk of chronic migraine. Keep well hydrated and drink 6-8 glasses of water per day.   4. Initiate non-pharmacologic measures at the earliest onset of your headache. Rest and quiet environment. Relax and reduce stress. Breathe2Relax is a free app that can instruct you on    some simple relaxtion and breathing techniques. Http://Dawnbuse.com is a    free website that provides teaching videos on relaxation.  Also, there are  many apps that   can be downloaded for "mindful" relaxation.  An app called YOGA NIDRA will help walk you through mindfulness. Another app called Calm can be downloaded to give you a structured mindfulness guide with daily reminders and skill development. Headspace for guided meditation Mindfulness Based Stress Reduction Online Course: www.palousemindfulness.com Cold compresses.   5. Don't wait!! Take the maximum allowable dosage of prescribed medication at the first sign of migraine.   6. Compliance:  Take prescribed medication regularly as directed and at the first sign of a migraine.   7. Communicate:  Call your physician when problems arise,  especially if your headaches change, increase in frequency/severity, or become associated with neurological symptoms (weakness, numbness, slurred speech, etc.). Proceed to emergency room if you experience new or worsening symptoms or symptoms do not resolve, if you have new neurologic symptoms or if headache is severe, or for any concerning symptom.   8. Headache/pain management therapies: Consider various complementary methods, including medication, behavioral therapy, psychological counselling, biofeedback, massage therapy, acupuncture, dry needling, and other modalities.  Such measures may reduce the need for medications. Counseling for pain management, where patients learn to function and ignore/minimize their pain, seems to work very well.   9. Recommend changing family's attention and focus away from patient's headaches. Instead, emphasize daily activities. If first question of day is 'How are your headaches/Do you have a headache today?', then patient will constantly think about headaches, thus making them worse. Goal is to re-direct attention away from headaches, toward daily activities and other distractions.   10. Helpful Websites: www.AmericanHeadacheSociety.org PatentHood.ch www.headaches.org TightMarket.nl www.achenet.org

## 2024-01-03 ENCOUNTER — Encounter: Payer: Self-pay | Admitting: Family Medicine

## 2024-01-03 ENCOUNTER — Ambulatory Visit (INDEPENDENT_AMBULATORY_CARE_PROVIDER_SITE_OTHER): Payer: 59 | Admitting: Family Medicine

## 2024-01-03 ENCOUNTER — Other Ambulatory Visit: Payer: Self-pay | Admitting: Family Medicine

## 2024-01-03 ENCOUNTER — Telehealth: Payer: Self-pay | Admitting: *Deleted

## 2024-01-03 ENCOUNTER — Other Ambulatory Visit (HOSPITAL_COMMUNITY): Payer: Self-pay

## 2024-01-03 VITALS — BP 140/84 | HR 84 | Ht 65.0 in | Wt 184.0 lb

## 2024-01-03 DIAGNOSIS — Z6831 Body mass index (BMI) 31.0-31.9, adult: Secondary | ICD-10-CM

## 2024-01-03 DIAGNOSIS — G932 Benign intracranial hypertension: Secondary | ICD-10-CM | POA: Diagnosis not present

## 2024-01-03 DIAGNOSIS — E66811 Obesity, class 1: Secondary | ICD-10-CM

## 2024-01-03 MED ORDER — WEGOVY 0.25 MG/0.5ML ~~LOC~~ SOAJ: 0.2500 mg | SUBCUTANEOUS | 0 refills | Status: AC

## 2024-01-03 NOTE — Telephone Encounter (Signed)
 Pharmacy Patient Advocate Encounter   Received notification from Physician's Office that prior authorization for Wegovy  0.25mg /0.34ml pen is required/requested.   Insurance verification completed.   The patient is insured through [facility] .   Per test claim: PA required; PA submitted to above mentioned insurance via Latent Key/confirmation #/EOC [id number] Status is pending

## 2024-01-04 NOTE — Telephone Encounter (Signed)
 Pharmacy Patient Advocate Encounter  Received notification from [employer] that Prior Authorization for Wegovy  has been DENIED.  Full denial letter will be uploaded to the media tab. See denial reason below.   PA #/Case ID/Reference #: [id number]

## 2024-01-07 NOTE — Telephone Encounter (Signed)
 Amy- how do you want to proceed?

## 2024-02-19 NOTE — Telephone Encounter (Signed)
 Called and left voicemail for patient to reschedule appointment on 11/3 with Dr Ines.  If patient calls back, they can be rescheduled with Dr Margaret

## 2024-03-17 ENCOUNTER — Ambulatory Visit: Payer: Self-pay | Admitting: Neurology

## 2024-03-24 ENCOUNTER — Encounter: Payer: Self-pay | Admitting: Family Medicine

## 2024-05-23 ENCOUNTER — Telehealth: Admitting: Family Medicine

## 2024-05-23 DIAGNOSIS — R21 Rash and other nonspecific skin eruption: Secondary | ICD-10-CM | POA: Diagnosis not present

## 2024-05-23 MED ORDER — HYDROCORTISONE 0.5 % EX CREA: 1.0000 | TOPICAL_CREAM | Freq: Two times a day (BID) | CUTANEOUS | 0 refills | Status: AC

## 2024-05-23 MED ORDER — PREDNISONE 20 MG PO TABS: 20.0000 mg | ORAL_TABLET | Freq: Two times a day (BID) | ORAL | 0 refills | Status: AC

## 2024-05-23 NOTE — Patient Instructions (Signed)
 Rash, Adult A rash is a breakout of spots or blotches on the skin. It can affect the way the skin looks and feels. Many things can cause a rash. Common causes include: Viral infections. These include colds, measles, and hand, foot, and mouth disease. Bacterial infections. These include scarlet fever and impetigo. Fungal infections. These include athlete's foot, ringworm, and yeast rashes. Skin irritation. This may be from heat rash, exposure to moisture or friction for a long time (intertrigo), or exposure to soap or skin care products (eczema). Allergic reactions. These may be caused by foods, medicines, or things like poison ivy. Some rashes may go away after a few days. Others may last for a few weeks. The goal of treatment is to stop the itching and keep the rash from spreading. Follow these instructions at home: Medicine Take or apply over-the-counter and prescription medicines only as told by your health care provider. These may include: Corticosteroids. These can help treat red or swollen skin. They may be given as creams or as medicines to take by mouth (orally). Anti-itch lotions. Allergy medicines. Pain medicine. Antifungal medicine if the rash is from a fungal infection. Antibiotics if you have an infection.  Skin care Apply cool, wet cloths (compresses) to the affected areas. Do not scratch or rub your skin. Avoid covering the rash. Keep it exposed to air as often as you can. Managing itching and discomfort Avoid hot showers and baths. These can make itching worse. A cold shower may help. Try taking a bath with: Epsom salts. You can get these at your local pharmacy or grocery store. Follow the instructions on the package. Baking soda. Pour a small amount into the bath as told by your provider. Colloidal oatmeal. You can get this at your local pharmacy or grocery store. Follow the instructions on the package. Try putting baking soda paste on your skin. Stir water into baking  soda until it becomes like a paste. Try using calamine lotion or cortisone cream to help with itchiness. Keep cool. Stay out of the sun. Sweating and being hot can make itching worse. General instructions  Rest as needed. Drink enough fluid to keep your pee (urine) pale yellow. Wear loose-fitting clothes. Avoid scented soaps, detergents, and perfumes. Use gentle soaps, detergents, perfumes, and cosmetics. Avoid the things that cause your rash (triggers). Keep a journal to help keep track of your triggers. Write down: What you eat. What cosmetics you use. What you drink. What you wear. This includes jewelry. Contact a health care provider if: You sweat at night more than normal. You pee (urinate) more or less than normal, or your pee is a darker color than normal. Your eyes become sensitive to light. Your skin or the white parts of your eyes turn yellow (jaundice). Your skin tingles or is numb. You get painful blisters in your nose or mouth. Your rash does not go away after a few days, or it gets worse. You are more tired or thirsty than normal. You have new or worse symptoms. These may include: Pain in your abdomen. Fever. Diarrhea or vomiting. Weakness or weight loss. Get help right away if: You get confused. You have a severe headache, a stiff neck, or severe joint pain or stiffness. You become very sleepy or not responsive. You have a seizure. This information is not intended to replace advice given to you by your health care provider. Make sure you discuss any questions you have with your health care provider. Document Revised: 02/17/2022 Document Reviewed:  02/17/2022 Elsevier Patient Education  2024 [employer].

## 2024-05-23 NOTE — Progress Notes (Signed)
 " Virtual Visit Consent   [name], you are scheduled for a virtual visit with a [facility] provider today. Just as with appointments in the office, your consent must be obtained to participate. Your consent will be active for this visit and any virtual visit you may have with one of our providers in the next 365 days. If you have a MyChart account, a copy of this consent can be sent to you electronically.  As this is a virtual visit, video technology does not allow for your provider to perform a traditional examination. This may limit your provider's ability to fully assess your condition. If your provider identifies any concerns that need to be evaluated in person or the need to arrange testing (such as labs, EKG, etc.), we will make arrangements to do so. Although advances in technology are sophisticated, we cannot ensure that it will always work on either your end or our end. If the connection with a video visit is poor, the visit may have to be switched to a telephone visit. With either a video or telephone visit, we are not always able to ensure that we have a secure connection.  By engaging in this virtual visit, you consent to the provision of healthcare and authorize for your insurance to be billed (if applicable) for the services provided during this visit. Depending on your insurance coverage, you may receive a charge related to this service.  I need to obtain your verbal consent now. Are you willing to proceed with your visit today? [name] has provided verbal consent on 05/23/2024 for a virtual visit (video or telephone). Loa Lamp, FNP  Date: 05/23/2024 12:33 PM   Virtual Visit via Video Note   I, Loa Lamp, connected with  Christine Snyder  ([id number], Jun 15, 1995) on 05/23/2024 at 12:30 PM EST by a video-enabled telemedicine application and verified that I am speaking with the correct person using two identifiers.  Location: Patient: Virtual Visit Location Patient:  Home Provider: Virtual Visit Location Provider: Home Office   I discussed the limitations of evaluation and management by telemedicine and the availability of in person appointments. The patient expressed understanding and agreed to proceed.    History of Present Illness: Christine Snyder is a 29 y.o. who identifies as a female who was assigned female at birth, and is being seen today for rash on neck and arms. It itches. It is spreading. Tried a new perfume. Not on face at this time. Eye lids are bumpy.   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  Resting comfortably  at home.  Head is normocephalic, atraumatic.  No labored breathing.  Speech is clear and coherent with logical content.  Patient is alert and oriented at baseline.    Assessment and Plan: 1. Rash (Primary)  Keep clean and dry, UC if sx worsen.   Follow Up Instructions: I discussed the assessment and treatment plan with the patient. The patient was provided an opportunity to ask questions and all were answered. The patient agreed with the plan and demonstrated an understanding of the instructions.  A copy of instructions were sent to the patient via MyChart unless otherwise noted below.     The patient was advised to call back or seek an in-person evaluation  if the symptoms worsen or if the condition fails to improve as anticipated.    Raena Pau, FNP     [1]  Allergies Allergen Reactions   Mushroom Itching  [2]  Current Outpatient Medications:    hydrocortisone  cream 0.5 %, Apply 1 Application topically 2 (two) times daily., Disp: 30 g, Rfl: 0   predniSONE  (DELTASONE ) 20 MG tablet, Take 1 tablet (20 mg total) by mouth 2  (two) times daily with a meal for 5 days., Disp: 10 tablet, Rfl: 0   acetaZOLAMIDE  (DIAMOX ) 250 MG tablet, Take 1 tablet (250 mg total) by mouth 2 (two) times daily., Disp: 180 tablet, Rfl: 1   cyclobenzaprine  (FLEXERIL ) 10 MG tablet, Take 1 tablet (10 mg total) by mouth 3 (three) times daily as needed., Disp: 15 tablet, Rfl: 0   semaglutide -weight management (WEGOVY ) 0.25 MG/0.5ML SOAJ SQ injection, Inject 0.25 mg into the skin once a week., Disp: 2 mL, Rfl: 0  "

## 2024-08-04 ENCOUNTER — Ambulatory Visit: Admitting: Family Medicine
